# Patient Record
Sex: Male | Born: 1965 | Race: White | Hispanic: No | Marital: Married | Smoking: Former smoker
Health system: Southern US, Community
[De-identification: ages and names within clinical notes are randomized; demographics above are authoritative.]

## PROBLEM LIST (undated history)

## (undated) DIAGNOSIS — I10 Essential (primary) hypertension: Secondary | ICD-10-CM

## (undated) DIAGNOSIS — I729 Aneurysm of unspecified site: Secondary | ICD-10-CM

## (undated) DIAGNOSIS — E538 Deficiency of other specified B group vitamins: Secondary | ICD-10-CM

## (undated) HISTORY — PX: ADENOIDECTOMY: SUR15

## (undated) HISTORY — DX: Deficiency of other specified B group vitamins: E53.8

## (undated) HISTORY — PX: WISDOM TOOTH EXTRACTION: SHX21

---

## 2000-06-23 ENCOUNTER — Other Ambulatory Visit: Admission: RE | Admit: 2000-06-23 | Discharge: 2000-06-23 | Payer: Self-pay | Admitting: *Deleted

## 2002-02-14 ENCOUNTER — Encounter: Payer: Self-pay | Admitting: Family Medicine

## 2002-02-14 ENCOUNTER — Ambulatory Visit (HOSPITAL_COMMUNITY): Admission: RE | Admit: 2002-02-14 | Discharge: 2002-02-14 | Payer: Self-pay | Admitting: Family Medicine

## 2002-02-28 ENCOUNTER — Encounter: Payer: Self-pay | Admitting: Neurosurgery

## 2002-02-28 ENCOUNTER — Encounter: Admission: RE | Admit: 2002-02-28 | Discharge: 2002-02-28 | Payer: Self-pay | Admitting: Neurosurgery

## 2002-03-14 ENCOUNTER — Encounter: Payer: Self-pay | Admitting: Neurosurgery

## 2002-03-14 ENCOUNTER — Encounter: Admission: RE | Admit: 2002-03-14 | Discharge: 2002-03-14 | Payer: Self-pay | Admitting: Neurosurgery

## 2015-05-18 ENCOUNTER — Ambulatory Visit (INDEPENDENT_AMBULATORY_CARE_PROVIDER_SITE_OTHER): Payer: BLUE CROSS/BLUE SHIELD | Admitting: Family Medicine

## 2015-05-18 ENCOUNTER — Encounter: Payer: Self-pay | Admitting: Family Medicine

## 2015-05-18 VITALS — BP 130/90 | Temp 98.3°F | Wt 201.2 lb

## 2015-05-18 DIAGNOSIS — J189 Pneumonia, unspecified organism: Secondary | ICD-10-CM

## 2015-05-18 DIAGNOSIS — J209 Acute bronchitis, unspecified: Secondary | ICD-10-CM | POA: Diagnosis not present

## 2015-05-18 MED ORDER — CLARITHROMYCIN 500 MG PO TABS: 500.0000 mg | ORAL_TABLET | Freq: Two times a day (BID) | ORAL | Status: DC

## 2015-05-18 MED ORDER — HYDROCODONE-HOMATROPINE 5-1.5 MG/5ML PO SYRP: 5.0000 mL | ORAL_SOLUTION | Freq: Four times a day (QID) | ORAL | Status: DC | PRN

## 2015-05-18 NOTE — Progress Notes (Signed)
   Subjective:    Patient ID: Derek Barrett, male    DOB: December 28, 1965, 50 y.o.   MRN: [id number]  Cough This is a new problem. The current episode started in the past 7 days. The problem has been gradually worsening. The cough is non-productive. Associated symptoms include chest pain, rhinorrhea, shortness of breath and wheezing. Pertinent negatives include no ear pain or fever. Nothing aggravates the symptoms. Treatments tried: Mucinex. tylenol. The treatment provided no relief.   Patient states that he has no other concerns at this time.    Review of Systems  Constitutional: Negative for fever and activity change.  HENT: Positive for congestion and rhinorrhea. Negative for ear pain.   Eyes: Negative for discharge.  Respiratory: Positive for cough, shortness of breath and wheezing.   Cardiovascular: Positive for chest pain.       Objective:   Physical Exam  Constitutional: He appears well-developed.  HENT:  Head: Normocephalic.  Mouth/Throat: Oropharynx is clear and moist. No oropharyngeal exudate.  Neck: Normal range of motion.  Cardiovascular: Normal rate, regular rhythm and normal heart sounds.   No murmur heard. Pulmonary/Chest: Effort normal. He has no wheezes.  Patient does have pulmonary congestion noted in the right middle lobe not in respiratory distress  Lymphadenopathy:    He has no cervical adenopathy.  Neurological: He exhibits normal muscle tone.  Skin: Skin is warm and dry.  Nursing note and vitals reviewed.     I believe patient has bronchitis he also has some findings of early pneumonia. Does not appear to be toxic should get better with medication patient was told if he gets worse to follow-up immediately call if any problems should be able to return to work by Wednesday. Cough medication only for home use    Assessment & Plan:  Early pneumonia Warning signs discussed If high fevers difficulty breathing or worse follow-up Antibiotics  prescribed Follow-up if ongoing troubles X-rays lab work not indicated.

## 2016-03-02 ENCOUNTER — Ambulatory Visit: Payer: BLUE CROSS/BLUE SHIELD | Admitting: Family Medicine

## 2016-03-16 ENCOUNTER — Ambulatory Visit (HOSPITAL_COMMUNITY)
Admission: RE | Admit: 2016-03-16 | Discharge: 2016-03-16 | Disposition: A | Discharge status: Home / Self Care | Payer: BLUE CROSS/BLUE SHIELD | Source: Ambulatory Visit | Attending: Family Medicine | Admitting: Family Medicine

## 2016-03-16 ENCOUNTER — Encounter: Payer: Self-pay | Admitting: Family Medicine

## 2016-03-16 ENCOUNTER — Ambulatory Visit (INDEPENDENT_AMBULATORY_CARE_PROVIDER_SITE_OTHER): Payer: BLUE CROSS/BLUE SHIELD | Admitting: Family Medicine

## 2016-03-16 VITALS — BP 140/90 | Ht 72.0 in | Wt 204.6 lb

## 2016-03-16 DIAGNOSIS — R059 Cough, unspecified: Secondary | ICD-10-CM

## 2016-03-16 DIAGNOSIS — Z131 Encounter for screening for diabetes mellitus: Secondary | ICD-10-CM

## 2016-03-16 DIAGNOSIS — R0781 Pleurodynia: Secondary | ICD-10-CM

## 2016-03-16 DIAGNOSIS — Z13220 Encounter for screening for lipoid disorders: Secondary | ICD-10-CM | POA: Diagnosis not present

## 2016-03-16 DIAGNOSIS — X58XXXA Exposure to other specified factors, initial encounter: Secondary | ICD-10-CM | POA: Insufficient documentation

## 2016-03-16 DIAGNOSIS — Z125 Encounter for screening for malignant neoplasm of prostate: Secondary | ICD-10-CM | POA: Diagnosis not present

## 2016-03-16 DIAGNOSIS — R05 Cough: Secondary | ICD-10-CM | POA: Insufficient documentation

## 2016-03-16 DIAGNOSIS — S2241XA Multiple fractures of ribs, right side, initial encounter for closed fracture: Secondary | ICD-10-CM | POA: Insufficient documentation

## 2016-03-16 NOTE — Progress Notes (Signed)
   Subjective:    Patient ID: Derek Barrett, male    DOB: [birth year], 50 y.o.   MRN: [id number]  HPI Patient arrives to discuss some personal issues.   Patient states he fell a while back and he thinks he broke a rib that he did not have treated and he reports a cough since this happened.Patient states he has soreness pain discomfort in the right ribs. He is also noted a chronic cough over the past 6 months no hemoptysis no fevers no sweats or chills. Denies shortness of breath. He is a smoker is been smoking from [place] 27 years of pack a day   Patient would also like to have some routine labs. 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 Negative for activity change and fever.  HENT: Positive for sneezing. Negative for congestion, ear pain and rhinorrhea.   Eyes: Negative for discharge.  Respiratory: Positive for cough and shortness of breath. Negative for wheezing.   Cardiovascular: Negative for chest pain.  Gastrointestinal: Negative for abdominal pain.       Objective:   Physical Exam  Constitutional: He appears well-nourished. No distress.  Cardiovascular: Normal rate, regular rhythm and normal heart sounds.   No murmur heard. Pulmonary/Chest: Effort normal and breath sounds normal. No respiratory distress.  Musculoskeletal: He exhibits no edema.  Lymphadenopathy:    He has no cervical adenopathy.  Neurological: He is alert.  Psychiatric: His behavior is normal.  Vitals reviewed.         Assessment & Plan:  Lab work ordered await results Chronic cough could be related to smoking encourage patient to quit smoking I went over multiple different ways he will think about it  X-ray of the chest and ribs causes a cough and a rib injury a need a CAT scan may need pulmonary function testing  Preventative labs ordered. Recommend preventative health  exam Recommend colonoscopy patient will consider

## 2016-03-17 ENCOUNTER — Telehealth: Payer: Self-pay | Admitting: Family Medicine

## 2016-03-17 DIAGNOSIS — R05 Cough: Secondary | ICD-10-CM

## 2016-03-17 DIAGNOSIS — R053 Chronic cough: Secondary | ICD-10-CM

## 2016-03-17 LAB — BASIC METABOLIC PANEL
BUN/Creatinine Ratio: 13 (ref 9–20)
BUN: 11 mg/dL (ref 6–24)
CO2: 24 mmol/L (ref 18–29)
Calcium: 9.6 mg/dL (ref 8.7–10.2)
Chloride: 103 mmol/L (ref 96–106)
Creatinine, Ser: 0.87 mg/dL (ref 0.76–1.27)
GFR calc Af Amer: 116 mL/min/{1.73_m2} (ref >59)
GFR calc non Af Amer: 101 mL/min/{1.73_m2} (ref >59)
Glucose: 108 mg/dL — ABNORMAL HIGH (ref 65–99)
Potassium: 5.1 mmol/L (ref 3.5–5.2)
Sodium: 143 mmol/L (ref 134–144)

## 2016-03-17 LAB — LIPID PANEL
Chol/HDL Ratio: 2.1 ratio units (ref 0.0–5.0)
Cholesterol, Total: 179 mg/dL (ref 100–199)
HDL: 84 mg/dL (ref >39)
LDL Calculated: 84 mg/dL (ref 0–99)
Triglycerides: 55 mg/dL (ref 0–149)
VLDL Cholesterol Cal: 11 mg/dL (ref 5–40)

## 2016-03-17 LAB — CBC WITH DIFFERENTIAL/PLATELET
Basophils Absolute: 0 10*3/uL (ref 0.0–0.2)
Basos: 0 %
EOS (ABSOLUTE): 0.2 10*3/uL (ref 0.0–0.4)
Eos: 4 %
Hematocrit: 43.7 % (ref 37.5–51.0)
Hemoglobin: 15.4 g/dL (ref 12.6–17.7)
Immature Grans (Abs): 0 10*3/uL (ref 0.0–0.1)
Immature Granulocytes: 0 %
Lymphocytes Absolute: 1.6 10*3/uL (ref 0.7–3.1)
Lymphs: 34 %
MCH: 33.5 pg — ABNORMAL HIGH (ref 26.6–33.0)
MCHC: 35.2 g/dL (ref 31.5–35.7)
MCV: 95 fL (ref 79–97)
Monocytes Absolute: 0.4 10*3/uL (ref 0.1–0.9)
Monocytes: 9 %
Neutrophils Absolute: 2.4 10*3/uL (ref 1.4–7.0)
Neutrophils: 53 %
Platelets: 218 10*3/uL (ref 150–379)
RBC: 4.6 x10E6/uL (ref 4.14–5.80)
RDW: 12.7 % (ref 12.3–15.4)
WBC: 4.6 10*3/uL (ref 3.4–10.8)

## 2016-03-17 LAB — HEPATIC FUNCTION PANEL
ALT: 12 IU/L (ref 0–44)
AST: 18 IU/L (ref 0–40)
Albumin: 4.6 g/dL (ref 3.5–5.5)
Alkaline Phosphatase: 79 IU/L (ref 39–117)
Bilirubin Total: 0.4 mg/dL (ref 0.0–1.2)
Bilirubin, Direct: 0.15 mg/dL (ref 0.00–0.40)
Total Protein: 7.2 g/dL (ref 6.0–8.5)

## 2016-03-17 LAB — PSA: Prostate Specific Ag, Serum: 0.4 ng/mL (ref 0.0–4.0)

## 2016-03-17 NOTE — Telephone Encounter (Signed)
I discussed the results of the x-rays and his lab work with the patient. This patient is been having a chronic cough for 6 months. He is a lifelong smoker. He has been counseled to quit. I told the patient because of his chronic cough this needs to be evaluated further. I recommend for this patient a CT scan of the chest as well as pulmonary function tests pre-and post included. Nurse's-please help set up these tests.(The patient was told that if scan is not covered we may need to get him in with pulmonology)

## 2016-03-18 NOTE — Telephone Encounter (Signed)
Left message on voicemail to return call. (CT chest and PFT order in the sytem) Need to know when patient can go for CT scan. Hospital will contact patient to schedule PFT.

## 2016-03-18 NOTE — Addendum Note (Signed)
Addended by: Jesusita Oka on: 03/18/2016 09:38 AM   Modules accepted: Orders

## 2016-03-22 NOTE — Telephone Encounter (Signed)
Talked with patient and he wants this test pre authorized first before scheduling to make sure it is paid for.

## 2016-03-24 ENCOUNTER — Telehealth: Payer: Self-pay | Admitting: Family Medicine

## 2016-03-24 NOTE — Telephone Encounter (Signed)
Pt can go for scan on Friday morning, if not then he can go Thursday am.

## 2016-03-24 NOTE — Telephone Encounter (Signed)
Test scheduled at [facility] oct 31st 10 am register 9:45. Pt notified of appt and also notified that [facility] will contact him for the PFT scheduling.

## 2016-03-24 NOTE — Telephone Encounter (Signed)
Left message return call to inform patient that we have his CT Chest w/o contrast approved through insurance at [facility] and we now just need to schedule it.

## 2016-04-01 ENCOUNTER — Ambulatory Visit (HOSPITAL_COMMUNITY): Payer: BLUE CROSS/BLUE SHIELD

## 2016-04-05 ENCOUNTER — Ambulatory Visit (HOSPITAL_COMMUNITY)
Admission: RE | Admit: 2016-04-05 | Discharge: 2016-04-05 | Disposition: A | Discharge status: Home / Self Care | Payer: BLUE CROSS/BLUE SHIELD | Source: Ambulatory Visit | Attending: Family Medicine | Admitting: Family Medicine

## 2016-04-05 DIAGNOSIS — R05 Cough: Secondary | ICD-10-CM | POA: Insufficient documentation

## 2016-04-05 DIAGNOSIS — J40 Bronchitis, not specified as acute or chronic: Secondary | ICD-10-CM | POA: Diagnosis not present

## 2016-04-05 DIAGNOSIS — I712 Thoracic aortic aneurysm, without rupture: Secondary | ICD-10-CM | POA: Diagnosis not present

## 2016-04-05 DIAGNOSIS — I2510 Atherosclerotic heart disease of native coronary artery without angina pectoris: Secondary | ICD-10-CM | POA: Insufficient documentation

## 2016-04-05 DIAGNOSIS — S2241XA Multiple fractures of ribs, right side, initial encounter for closed fracture: Secondary | ICD-10-CM | POA: Insufficient documentation

## 2016-04-05 DIAGNOSIS — X58XXXA Exposure to other specified factors, initial encounter: Secondary | ICD-10-CM | POA: Diagnosis not present

## 2016-04-06 ENCOUNTER — Other Ambulatory Visit: Payer: Self-pay | Admitting: *Deleted

## 2016-04-06 DIAGNOSIS — I712 Thoracic aortic aneurysm, without rupture, unspecified: Secondary | ICD-10-CM

## 2016-04-07 ENCOUNTER — Encounter: Payer: Self-pay | Admitting: Family Medicine

## 2016-04-20 ENCOUNTER — Institutional Professional Consult (permissible substitution) (INDEPENDENT_AMBULATORY_CARE_PROVIDER_SITE_OTHER): Payer: BLUE CROSS/BLUE SHIELD | Admitting: Surgery

## 2016-04-20 ENCOUNTER — Encounter: Payer: Self-pay | Admitting: Surgery

## 2016-04-20 VITALS — BP 148/97 | HR 75 | Resp 18 | Ht 72.0 in | Wt 204.0 lb

## 2016-04-20 DIAGNOSIS — I712 Thoracic aortic aneurysm, without rupture, unspecified: Secondary | ICD-10-CM

## 2016-04-21 ENCOUNTER — Encounter: Payer: Self-pay | Admitting: Family Medicine

## 2016-04-21 ENCOUNTER — Encounter: Payer: Self-pay | Admitting: Surgery

## 2016-04-21 DIAGNOSIS — I712 Thoracic aortic aneurysm, without rupture, unspecified: Secondary | ICD-10-CM | POA: Insufficient documentation

## 2016-04-21 NOTE — Progress Notes (Signed)
PCP is Sallee Lange, MD Referring Provider is Kathyrn Drown, MD  Chief Complaint  Patient presents with  . Thoracic Aortic Aneurysm    Surgical eval, Chest CT 04/05/16    HPI:  The patient is a 50 year old healthy gentleman who fell several months ago striking his right lateral chest and back. He had a lot of pain but did not seek medical attention. He saw Dr. Wolfgang Phoenix last month and reported a 6 month history of cough and with his long smoking history a CXR was done. This showed fractures of the anterior 8th and 9th ribs but no lung abnormality to account for his cough. He had a CT of the chest done on 10/31 which showed healing fractures of the right 7th through 12th ribs and an incidental 4.0 cm fusiform ascending aortic aneurysm. There is no family history of aneurysm disease or bicuspid aortic valve disease. No family history of connective tissue disorder.  History reviewed. No pertinent past medical history.  History reviewed. No pertinent surgical history.  History reviewed. No pertinent family history.  Social History Social History  Substance Use Topics  . Smoking status: Current Every Day Smoker    Packs/day: 1.00  . Smokeless tobacco: Never Used     Comment: 1 pack a day since age 45  . Alcohol use Not on file    No current outpatient prescriptions on file.   No current facility-administered medications for this visit.     Allergies  Allergen Reactions  . Penicillins     Review of Systems  Constitutional: Negative for activity change, appetite change, chills, fatigue, fever and unexpected weight change.  HENT: Negative.   Eyes: Negative.   Respiratory: Positive for cough. Negative for shortness of breath and wheezing.   Cardiovascular: Negative.  Negative for chest pain.  Gastrointestinal: Negative.   Endocrine: Negative.   Genitourinary: Negative.   Musculoskeletal:       Mild residual pain right lateral and posterior chest wall.  Skin: Negative.    Allergic/Immunologic: Negative.   Neurological: Negative.   Hematological: Negative.   Psychiatric/Behavioral: Negative.     BP (!) 148/97 (BP Location: Left Arm, Patient Position: Sitting, Cuff Size: Normal)   Pulse 75   Resp 18   Ht 6' (1.829 m)   Wt 204 lb (92.5 kg)   SpO2 97% Comment: RA  BMI 27.67 kg/m  Physical Exam  Constitutional: He is oriented to person, place, and time. He appears well-developed and well-nourished. No distress.  HENT:  Head: Normocephalic and atraumatic.  Mouth/Throat: Oropharynx is clear and moist.  Eyes: EOM are normal. Pupils are equal, round, and reactive to light.  Neck: Neck supple. No JVD present. No thyromegaly present.  Cardiovascular: Normal rate, regular rhythm and normal heart sounds.   No murmur heard. Pulmonary/Chest: Effort normal and breath sounds normal. No respiratory distress. He has no wheezes. He exhibits no tenderness.  Abdominal: Soft. Bowel sounds are normal. He exhibits no distension and no mass. There is no tenderness.  Musculoskeletal: Normal range of motion. He exhibits no edema.  Lymphadenopathy:    He has no cervical adenopathy.  Neurological: He is alert and oriented to person, place, and time. He has normal strength. No cranial nerve deficit or sensory deficit.  Skin: Skin is warm and dry.  Psychiatric: He has a normal mood and affect.    Diagnostic Tests:  CLINICAL DATA:  Chronic cough for 6 months, rib fractures secondary to a fall 3-4  months ago, smoker  EXAM: CT CHEST WITHOUT CONTRAST  TECHNIQUE: Multidetector CT imaging of the chest was performed following the standard protocol without IV contrast. Sagittal and coronal MPR images reconstructed from axial data set.  COMPARISON:  None; correlation chest radiograph 03/16/2016  FINDINGS: Cardiovascular: Minimal coronary arterial calcification. Aneurysmal dilatation ascending thoracic aorta 4.0 cm transverse image 77. No pericardial  effusion.  Mediastinum/Nodes: Esophagus unremarkable. No thoracic adenopathy, with scattered normal sized mediastinal and axillary nodes noted. Base of cervical region unremarkable.  Lungs/Pleura: Central peribronchial thickening. Lungs clear. No infiltrate, pleural effusion or pneumothorax. No definite pulmonary mass/ nodule.  Upper Abdomen: Visualized upper abdomen unremarkable.  Musculoskeletal: Healing fractures of the RIGHT seventh, eighth, ninth, tenth, eleventh, and twelfth ribs. No additional osseous abnormalities.  IMPRESSION: Healing fractures of the RIGHT seventh through twelfth ribs.  Minimal coronary arterial calcification.  Central bronchitic changes without infiltrate.  Aneurysmal dilatation ascending thoracic aorta 4.0 cm greatest diameter, recommendation below Recommend annual imaging followup by CTA or MRA. This recommendation follows 2010 ACCF/AHA/AATS/ACR/ASA/SCA/SCAI/SIR/STS/SVM Guidelines for the Diagnosis and Management of Patients with Thoracic Aortic Disease. Circulation. 2010; [id number].   Electronically Signed   By: Lavonia Dana M.D.   On: 04/05/2016 10:49  Impression:  I have personally reviewed and interpreted his CT scan of the chest. He has a 4.0 cm fusiform ascending aortic aneurysm extending from the sinotubular junction to the proximal aortic arch. This is still well below the threshold for surgical treatment which is usually around 5.5 cm. It is unclear how long his aorta has been this size or whether it is changing. I think the best course of action is continued follow up to establish stability with a CT scan in one year. If it remains stable for a couple years then spacing the scans out to every other year is fine with this size of an aneurysm. I reviewed the images with him and his wife and answered their questions. I discussed the importance of good blood pressure control and smoking cessation. He has 6 healing rib fractures on  the right and there is nothing to do about those. They should heal with time and cause no long term ill effects.    Plan:  I will see him back in one year with a CTA of the chest.   I spent 45 minutes performing this consultation and > 50% of this time was spent face to face counseling and coordinating the care of this patient's ascending aortic aneurysm and rib fractures.   Gaye Pollack, MD [facility] [phone]

## 2016-06-08 ENCOUNTER — Ambulatory Visit: Payer: BLUE CROSS/BLUE SHIELD | Admitting: Family Medicine

## 2017-03-30 ENCOUNTER — Other Ambulatory Visit: Payer: Self-pay | Admitting: Surgery

## 2017-03-30 DIAGNOSIS — I7121 Aneurysm of the ascending aorta, without rupture: Secondary | ICD-10-CM

## 2017-03-30 DIAGNOSIS — I712 Thoracic aortic aneurysm, without rupture: Secondary | ICD-10-CM

## 2017-04-19 ENCOUNTER — Ambulatory Visit: Payer: BLUE CROSS/BLUE SHIELD | Admitting: Surgery

## 2017-04-19 ENCOUNTER — Other Ambulatory Visit: Payer: BLUE CROSS/BLUE SHIELD

## 2017-04-25 ENCOUNTER — Ambulatory Visit: Payer: BLUE CROSS/BLUE SHIELD | Admitting: Surgery

## 2017-04-26 ENCOUNTER — Ambulatory Visit: Payer: BLUE CROSS/BLUE SHIELD | Admitting: Surgery

## 2017-05-10 ENCOUNTER — Ambulatory Visit: Payer: BLUE CROSS/BLUE SHIELD | Admitting: Surgery

## 2017-05-10 ENCOUNTER — Other Ambulatory Visit: Payer: Self-pay

## 2017-05-10 ENCOUNTER — Encounter: Payer: Self-pay | Admitting: Surgery

## 2017-05-10 ENCOUNTER — Ambulatory Visit
Admission: RE | Admit: 2017-05-10 | Discharge: 2017-05-10 | Disposition: A | Discharge status: Home / Self Care | Payer: BLUE CROSS/BLUE SHIELD | Source: Ambulatory Visit | Attending: Surgery | Admitting: Surgery

## 2017-05-10 VITALS — BP 190/103 | HR 79 | Resp 18 | Ht 72.0 in | Wt 200.0 lb

## 2017-05-10 DIAGNOSIS — I7121 Aneurysm of the ascending aorta, without rupture: Secondary | ICD-10-CM

## 2017-05-10 DIAGNOSIS — I712 Thoracic aortic aneurysm, without rupture, unspecified: Secondary | ICD-10-CM

## 2017-05-10 MED ORDER — IOPAMIDOL (ISOVUE-370) INJECTION 76%
75.0000 mL | Freq: Once | INTRAVENOUS | Status: AC | PRN
  Administered 2017-05-10: 75 mL via INTRAVENOUS

## 2017-05-10 NOTE — Progress Notes (Signed)
     HPI:  The patient returns today for followup of an ascending aortic aneurysm that was noted to be 4.0 cm on CT scan in 03/2016. He denies any chest or back pain. He continues to smoke. He is with his wife today who has been encouraging him to stop smoking.   No current outpatient medications on file.   No current facility-administered medications for this visit.      Physical Exam: BP (!) 190/103 (BP Location: Left Arm, Cuff Size: Large)   Pulse 79   Resp 18   Ht 6' (1.829 m)   Wt 200 lb (90.7 kg)   SpO2 98% Comment: on RA  BMI 27.12 kg/m   He looks well. Lung exam is clear. Cardiac exam shows a regular rate and rhythm with normal heart sounds. There is no peripheral edema.   Diagnostic Tests:  CLINICAL DATA:  Ascending thoracic aortic aneurysm.  EXAM: CT ANGIOGRAPHY CHEST WITH CONTRAST  TECHNIQUE: Multidetector CT imaging of the chest was performed using the standard protocol during bolus administration of intravenous contrast. Multiplanar CT image reconstructions and MIPs were obtained to evaluate the vascular anatomy.  CONTRAST:  60mL ISOVUE-370 IOPAMIDOL (ISOVUE-370) INJECTION 76%  COMPARISON:  CT scan of April 05, 2016.  FINDINGS: Cardiovascular: Ascending thoracic aorta has maximum measured diameter 3.8 cm on this exam. Transverse aortic arch measures 2.8 cm. Proximal descending thoracic aorta measures 2.7 cm. No dissection is noted. Normal cardiac size. No pericardial effusion is noted.  Mediastinum/Nodes: No enlarged mediastinal, hilar, or axillary lymph nodes. Thyroid gland, trachea, and esophagus demonstrate no significant findings.  Lungs/Pleura: Lungs are clear. No pleural effusion or pneumothorax.  Upper Abdomen: No acute abnormality.  Musculoskeletal: No chest wall abnormality. No acute or significant osseous findings.  Review of the MIP images confirms the above findings.  IMPRESSION: There is no definite evidence of  thoracic aortic aneurysm seen on this exam, with the ascending aorta having a maximum measured diameter of 3.8 cm. No dissection is noted. No further follow-up is required.  No other abnormality seen in the chest.   Electronically Signed   By: Marijo Conception, M.D.   On: 05/10/2017 14:37   Impression:  The maximum diameter of the ascending aorta is 3.8 cm compared to his scan last year when it was 4.0 cm. I think this is just due to the error of measurement and the aorta is likely unchanged. This is mildly enlarged. I reviewed the CT images with him and and wife and answered their questions. He does have severe hypertension today and his initial BP today was 190/111.  He has not been treated for HTN and is on no meds. When I saw him last year his BP was 148/97. He does not check his BP at home. I discussed the importance of maintaining normal BP for preventing enlargement of the aorta and aortic dissection.   Plan:  He is going to follow up with Dr. Wolfgang Phoenix ASAP concerning his hypertension. I will see him back in two years for a repeat chest CT since the aorta is only 3.8 cm.   I spent 15 minutes performing this established patient evaluation and > 50% of this time was spent face to face counseling and coordinating the care of this patient's aortic aneurysm.    Gaye Pollack, MD [facility] [phone]

## 2018-03-04 ENCOUNTER — Encounter: Payer: Self-pay | Admitting: Emergency Medicine

## 2018-03-04 ENCOUNTER — Other Ambulatory Visit: Payer: Self-pay

## 2018-03-04 ENCOUNTER — Emergency Department
Admission: EM | Admit: 2018-03-04 | Discharge: 2018-03-04 | Disposition: A | Discharge status: Home / Self Care | Payer: BLUE CROSS/BLUE SHIELD | Attending: Emergency Medicine | Admitting: Emergency Medicine

## 2018-03-04 DIAGNOSIS — F17210 Nicotine dependence, cigarettes, uncomplicated: Secondary | ICD-10-CM | POA: Insufficient documentation

## 2018-03-04 DIAGNOSIS — I10 Essential (primary) hypertension: Secondary | ICD-10-CM | POA: Insufficient documentation

## 2018-03-04 HISTORY — DX: Aneurysm of unspecified site: I72.9

## 2018-03-04 LAB — COMPREHENSIVE METABOLIC PANEL
ALBUMIN: 4.6 g/dL (ref 3.5–5.0)
ALT: 13 U/L (ref 0–44)
ANION GAP: 9 (ref 5–15)
AST: 19 U/L (ref 15–41)
Alkaline Phosphatase: 67 U/L (ref 38–126)
BUN: 14 mg/dL (ref 6–20)
CO2: 27 mmol/L (ref 22–32)
Calcium: 9.4 mg/dL (ref 8.9–10.3)
Chloride: 104 mmol/L (ref 98–111)
Creatinine, Ser: 0.88 mg/dL (ref 0.61–1.24)
GFR calc Af Amer: >60 mL/min (ref >60)
GFR calc non Af Amer: >60 mL/min (ref >60)
GLUCOSE: 106 mg/dL — AB (ref 70–99)
POTASSIUM: 4.6 mmol/L (ref 3.5–5.1)
SODIUM: 140 mmol/L (ref 135–145)
Total Bilirubin: 1.1 mg/dL (ref 0.3–1.2)
Total Protein: 7.5 g/dL (ref 6.5–8.1)

## 2018-03-04 LAB — CBC WITH DIFFERENTIAL/PLATELET
Basophils Absolute: 0 10*3/uL (ref 0–0.1)
Basophils Relative: 0 %
EOS ABS: 0.2 10*3/uL (ref 0–0.7)
EOS PCT: 3 %
HCT: 49.4 % (ref 40.0–52.0)
Hemoglobin: 17.5 g/dL (ref 13.0–18.0)
Lymphocytes Relative: 42 %
Lymphs Abs: 2.3 10*3/uL (ref 1.0–3.6)
MCH: 35.5 pg — ABNORMAL HIGH (ref 26.0–34.0)
MCHC: 35.3 g/dL (ref 32.0–36.0)
MCV: 100.4 fL — ABNORMAL HIGH (ref 80.0–100.0)
MONO ABS: 0.6 10*3/uL (ref 0.2–1.0)
MONOS PCT: 11 %
NEUTROS PCT: 44 %
Neutro Abs: 2.5 10*3/uL (ref 1.4–6.5)
Platelets: 223 10*3/uL (ref 150–440)
RBC: 4.92 MIL/uL (ref 4.40–5.90)
RDW: 12.7 % (ref 11.5–14.5)
WBC: 5.6 10*3/uL (ref 3.8–10.6)

## 2018-03-04 LAB — TROPONIN I: Troponin I: <0.03 ng/mL (ref <0.03)

## 2018-03-04 LAB — ETHANOL

## 2018-03-04 MED ORDER — HYDROCHLOROTHIAZIDE 25 MG PO TABS
ORAL_TABLET | ORAL | Status: AC
  Filled 2018-03-04: qty 1

## 2018-03-04 MED ORDER — HYDROCHLOROTHIAZIDE 25 MG PO TABS
25.0000 mg | ORAL_TABLET | Freq: Every day | ORAL | Status: DC
  Administered 2018-03-04: 25 mg via ORAL

## 2018-03-04 MED ORDER — HYDROCHLOROTHIAZIDE 25 MG PO TABS: 25.0000 mg | ORAL_TABLET | Freq: Every day | ORAL | 1 refills | Status: DC

## 2018-03-04 NOTE — ED Notes (Signed)
Discharge instructions reviewed with patient. Questions fielded by this RN. Patient verbalizes understanding of instructions. Patient discharged home in stable condition per williams. No acute distress noted at time of discharge.   No peripheral IV placed this visit.

## 2018-03-04 NOTE — ED Provider Notes (Signed)
[facility] Emergency Department Provider Note       Time seen: ----------------------------------------- 6:49 PM on 03/04/2018 -----------------------------------------   I have reviewed the triage vital signs and the nursing notes.  HISTORY   Chief Complaint Hypertension    HPI Derek Barrett is a 52 y.o. male with a history of thoracic aneurysm who presents to the ED for high blood pressure.  Patient was a feeling well today and he took his blood pressure at home and it was high so he came in.  He denies fevers, chills, chest pain, shortness of breath, headache or vision changes.  He was told to get his blood pressure checked by his primary care doctor and also by his cardiologist because of this reported history of aneurysm.  Past Medical History:  Diagnosis Date  . Aneurysm [facility])     Patient Active Problem List   Diagnosis Date Noted  . Thoracic aortic aneurysm ([place]) 04/21/2016    History reviewed. No pertinent surgical history.  Allergies Penicillins  Social History Social History   Tobacco Use  . Smoking status: Current Every Day Smoker    Packs/day: 1.00  . Smokeless tobacco: Never Used  . Tobacco comment: 1 pack a day since age 69  Substance Use Topics  . Alcohol use: Never    Alcohol/week: 0.0 standard drinks    Frequency: Never  . Drug use: Never   Review of Systems Constitutional: Negative for fever. Cardiovascular: Negative for chest pain. Respiratory: Negative for shortness of breath. Gastrointestinal: Negative for abdominal pain, vomiting and diarrhea. Musculoskeletal: Negative for back pain. Skin: Negative for rash. Neurological: Negative for headaches, focal weakness or numbness.  All systems negative/normal/unremarkable except as stated in the HPI  ____________________________________________   PHYSICAL EXAM:  VITAL SIGNS: ED Triage Vitals  Enc Vitals Group     BP 03/04/18 1816 (!) 209/103     Pulse  Rate 03/04/18 1816 72     Resp 03/04/18 1816 14     Temp 03/04/18 1816 98.2 F (36.8 C)     Temp Source 03/04/18 1816 Oral     SpO2 03/04/18 1816 97 %     Weight 03/04/18 1816 200 lb (90.7 kg)     Height 03/04/18 1816 6' (1.829 m)     Head Circumference --      Peak Flow --      Pain Score 03/04/18 1823 0     Pain Loc --      Pain Edu? --      Excl. in [place]? --    Constitutional: Alert and oriented. Well appearing and in no distress. Eyes: Conjunctivae are normal. Normal extraocular movements. ENT   Head: Normocephalic and atraumatic.   Nose: No congestion/rhinnorhea.   Mouth/Throat: Mucous membranes are moist.   Neck: No stridor. Cardiovascular: Normal rate, regular rhythm. No murmurs, rubs, or gallops. Respiratory: Normal respiratory effort without tachypnea nor retractions. Breath sounds are clear and equal bilaterally. No wheezes/rales/rhonchi. Gastrointestinal: Soft and nontender. Normal bowel sounds Musculoskeletal: Nontender with normal range of motion in extremities. No lower extremity tenderness nor edema. Neurologic:  Normal speech and language. No gross focal neurologic deficits are appreciated.  Skin:  Skin is warm, dry and intact. No rash noted. Psychiatric: Mood and affect are normal. Speech and behavior are normal.  ____________________________________________  ED COURSE:  As part of my medical decision making, I reviewed the following data within the [place] History obtained from family if available, nursing notes, old  chart and ekg, as well as notes from prior ED visits. Patient presented for hypertension, we will assess with labs and imaging as indicated at this time.   Procedures ____________________________________________   LABS (pertinent positives/negatives)  Labs Reviewed  CBC WITH DIFFERENTIAL/PLATELET - Abnormal; Notable for the following components:      Result Value   MCV 100.4 (*)    MCH 35.5 (*)    All other  components within normal limits  COMPREHENSIVE METABOLIC PANEL - Abnormal; Notable for the following components:   Glucose, Bld 106 (*)    All other components within normal limits  TROPONIN I  ETHANOL  ____________________________________________  DIFFERENTIAL DIAGNOSIS   Hypertension, alcohol use disorder, anxiety, renal insufficiency  FINAL ASSESSMENT AND PLAN  Hypertension   Plan: The patient had presented for hypertension. Patient's labs do likely indicate some degree of alcohol use disorder with a high MCV.  I will start the patient on HCTZ for his blood pressure but I suspect his blood pressure changes may be alcohol related.  I will advise a gradual tapering of his alcohol use until he stopped drinking altogether over the next month.  He is advised to have close outpatient follow-up with his doctor.   Laurence Aly, MD   Note: This note was generated in part or whole with voice recognition software. Voice recognition is usually quite accurate but there are transcription errors that can and very often do occur. I apologize for any typographical errors that were not detected and corrected.     Earleen Newport, MD 03/04/18 [phone]

## 2018-03-04 NOTE — ED Triage Notes (Signed)
Pt here for HTN.  Wasn't feeling well today and took bp at home and was high so came in.  Denies CP/SHOB/HA/vision changes.  Patient was told to get blood pressure checked at PCP by cardiologist because has aneurysm of aorta.  Pt has not FU with doctor to keep blood pressure under control.

## 2018-03-12 ENCOUNTER — Ambulatory Visit: Payer: BLUE CROSS/BLUE SHIELD | Admitting: Family Medicine

## 2018-03-12 ENCOUNTER — Encounter: Payer: Self-pay | Admitting: Family Medicine

## 2018-03-12 VITALS — BP 128/88 | Ht 72.0 in | Wt 196.8 lb

## 2018-03-12 DIAGNOSIS — I10 Essential (primary) hypertension: Secondary | ICD-10-CM | POA: Diagnosis not present

## 2018-03-12 DIAGNOSIS — Z13220 Encounter for screening for lipoid disorders: Secondary | ICD-10-CM | POA: Diagnosis not present

## 2018-03-12 DIAGNOSIS — Z79899 Other long term (current) drug therapy: Secondary | ICD-10-CM

## 2018-03-12 DIAGNOSIS — D7589 Other specified diseases of blood and blood-forming organs: Secondary | ICD-10-CM | POA: Diagnosis not present

## 2018-03-12 DIAGNOSIS — Z125 Encounter for screening for malignant neoplasm of prostate: Secondary | ICD-10-CM

## 2018-03-12 MED ORDER — POTASSIUM CHLORIDE ER 10 MEQ PO TBCR: 10.0000 meq | EXTENDED_RELEASE_TABLET | Freq: Every day | ORAL | 5 refills | Status: DC

## 2018-03-12 MED ORDER — HYDROCHLOROTHIAZIDE 25 MG PO TABS: 25.0000 mg | ORAL_TABLET | Freq: Every day | ORAL | 5 refills | Status: DC

## 2018-03-12 NOTE — Patient Instructions (Addendum)
DASH Eating Plan DASH stands for "Dietary Approaches to Stop Hypertension." The DASH eating plan is a healthy eating plan that has been shown to reduce high blood pressure (hypertension). It may also reduce your risk for type 2 diabetes, heart disease, and stroke. The DASH eating plan may also help with weight loss. What are tips for following this plan? General guidelines  Avoid eating more than 2,300 mg (milligrams) of salt (sodium) a day. If you have hypertension, you may need to reduce your sodium intake to 1,500 mg a day.  Limit alcohol intake to no more than 1 drink a day for nonpregnant women and 2 drinks a day for men. One drink equals 12 oz of beer, 5 oz of wine, or 1 oz of hard liquor.  Work with your health care provider to maintain a healthy body weight or to lose weight. Ask what an ideal weight is for you.  Get at least 30 minutes of exercise that causes your heart to beat faster (aerobic exercise) most days of the week. Activities may include walking, swimming, or biking.  Work with your health care provider or diet and nutrition specialist (dietitian) to adjust your eating plan to your individual calorie needs. Reading food labels  Check food labels for the amount of sodium per serving. Choose foods with less than 5 percent of the Daily Value of sodium. Generally, foods with less than 300 mg of sodium per serving fit into this eating plan.  To find whole grains, look for the word "whole" as the first word in the ingredient list. Shopping  Buy products labeled as "low-sodium" or "no salt added."  Buy fresh foods. Avoid canned foods and premade or frozen meals. Cooking  Avoid adding salt when cooking. Use salt-free seasonings or herbs instead of table salt or sea salt. Check with your health care provider or pharmacist before using salt substitutes.  Do not fry foods. Cook foods using healthy methods such as baking, boiling, grilling, and broiling instead.  Cook with  heart-healthy oils, such as olive, canola, soybean, or sunflower oil. Meal planning   Eat a balanced diet that includes: ? 5 or more servings of fruits and vegetables each day. At each meal, try to fill half of your plate with fruits and vegetables. ? Up to 6-8 servings of whole grains each day. ? Less than 6 oz of lean meat, poultry, or fish each day. A 3-oz serving of meat is about the same size as a deck of cards. One egg equals 1 oz. ? 2 servings of low-fat dairy each day. ? A serving of nuts, seeds, or beans 5 times each week. ? Heart-healthy fats. Healthy fats called Omega-3 fatty acids are found in foods such as flaxseeds and coldwater fish, like sardines, salmon, and mackerel.  Limit how much you eat of the following: ? Canned or prepackaged foods. ? Food that is high in trans fat, such as fried foods. ? Food that is high in saturated fat, such as fatty meat. ? Sweets, desserts, sugary drinks, and other foods with added sugar. ? Full-fat dairy products.  Do not salt foods before eating.  Try to eat at least 2 vegetarian meals each week.  Eat more home-cooked food and less restaurant, buffet, and fast food.  When eating at a restaurant, ask that your food be prepared with less salt or no salt, if possible. What foods are recommended? The items listed may not be a complete list. Talk with your dietitian about what   dietary choices are best for you. Grains Whole-grain or whole-wheat bread. Whole-grain or whole-wheat pasta. Brown rice. Oatmeal. Quinoa. Bulgur. Whole-grain and low-sodium cereals. Pita bread. Low-fat, low-sodium crackers. Whole-wheat flour tortillas. Vegetables Fresh or frozen vegetables (raw, steamed, roasted, or grilled). Low-sodium or reduced-sodium tomato and vegetable juice. Low-sodium or reduced-sodium tomato sauce and tomato paste. Low-sodium or reduced-sodium canned vegetables. Fruits All fresh, dried, or frozen fruit. Canned fruit in natural juice (without  added sugar). Meat and other protein foods Skinless chicken or turkey. Ground chicken or turkey. Pork with fat trimmed off. Fish and seafood. Egg whites. Dried beans, peas, or lentils. Unsalted nuts, nut butters, and seeds. Unsalted canned beans. Lean cuts of beef with fat trimmed off. Low-sodium, lean deli meat. Dairy Low-fat (1%) or fat-free (skim) milk. Fat-free, low-fat, or reduced-fat cheeses. Nonfat, low-sodium ricotta or cottage cheese. Low-fat or nonfat yogurt. Low-fat, low-sodium cheese. Fats and oils Soft margarine without trans fats. Vegetable oil. Low-fat, reduced-fat, or light mayonnaise and salad dressings (reduced-sodium). Canola, safflower, olive, soybean, and sunflower oils. Avocado. Seasoning and other foods Herbs. Spices. Seasoning mixes without salt. Unsalted popcorn and pretzels. Fat-free sweets. What foods are not recommended? The items listed may not be a complete list. Talk with your dietitian about what dietary choices are best for you. Grains Baked goods made with fat, such as croissants, muffins, or some breads. Dry pasta or rice meal packs. Vegetables Creamed or fried vegetables. Vegetables in a cheese sauce. Regular canned vegetables (not low-sodium or reduced-sodium). Regular canned tomato sauce and paste (not low-sodium or reduced-sodium). Regular tomato and vegetable juice (not low-sodium or reduced-sodium). Pickles. Olives. Fruits Canned fruit in a light or heavy syrup. Fried fruit. Fruit in cream or butter sauce. Meat and other protein foods Fatty cuts of meat. Ribs. Fried meat. Bacon. Sausage. Bologna and other processed lunch meats. Salami. Fatback. Hotdogs. Bratwurst. Salted nuts and seeds. Canned beans with added salt. Canned or smoked fish. Whole eggs or egg yolks. Chicken or turkey with skin. Dairy Whole or 2% milk, cream, and half-and-half. Whole or full-fat cream cheese. Whole-fat or sweetened yogurt. Full-fat cheese. Nondairy creamers. Whipped toppings.  Processed cheese and cheese spreads. Fats and oils Butter. Stick margarine. Lard. Shortening. Ghee. Bacon fat. Tropical oils, such as coconut, palm kernel, or palm oil. Seasoning and other foods Salted popcorn and pretzels. Onion salt, garlic salt, seasoned salt, table salt, and sea salt. Worcestershire sauce. Tartar sauce. Barbecue sauce. Teriyaki sauce. Soy sauce, including reduced-sodium. Steak sauce. Canned and packaged gravies. Fish sauce. Oyster sauce. Cocktail sauce. Horseradish that you find on the shelf. Ketchup. Mustard. Meat flavorings and tenderizers. Bouillon cubes. Hot sauce and Tabasco sauce. Premade or packaged marinades. Premade or packaged taco seasonings. Relishes. Regular salad dressings. Where to find more information:  National Heart, Lung, and Blood Institute: www.nhlbi.nih.gov  American Heart Association: www.heart.org Summary  The DASH eating plan is a healthy eating plan that has been shown to reduce high blood pressure (hypertension). It may also reduce your risk for type 2 diabetes, heart disease, and stroke.  With the DASH eating plan, you should limit salt (sodium) intake to 2,300 mg a day. If you have hypertension, you may need to reduce your sodium intake to 1,500 mg a day.  When on the DASH eating plan, aim to eat more fresh fruits and vegetables, whole grains, lean proteins, low-fat dairy, and heart-healthy fats.  Work with your health care provider or diet and nutrition specialist (dietitian) to adjust your eating plan to your individual   calorie needs. This information is not intended to replace advice given to you by your health care provider. Make sure you discuss any questions you have with your health care provider. Document Released: 05/12/2011 Document Revised: 05/16/2016 Document Reviewed: 05/16/2016 Elsevier Interactive Patient Education  2018 [employer].  Hypertension Hypertension is another name for high blood pressure. High blood pressure  forces your heart to work harder to pump blood. This can cause problems over time. There are two numbers in a blood pressure reading. There is a top number (systolic) over a bottom number (diastolic). It is best to have a blood pressure below 120/80. Healthy choices can help lower your blood pressure. You may need medicine to help lower your blood pressure if:  Your blood pressure cannot be lowered with healthy choices.  Your blood pressure is higher than 130/80.  Follow these instructions at home: Eating and drinking  If directed, follow the DASH eating plan. This diet includes: ? Filling half of your plate at each meal with fruits and vegetables. ? Filling one quarter of your plate at each meal with whole grains. Whole grains include whole wheat pasta, brown rice, and whole grain bread. ? Eating or drinking low-fat dairy products, such as skim milk or low-fat yogurt. ? Filling one quarter of your plate at each meal with low-fat (lean) proteins. Low-fat proteins include fish, skinless chicken, eggs, beans, and tofu. ? Avoiding fatty meat, cured and processed meat, or chicken with skin. ? Avoiding premade or processed food.  Eat less than 1,500 mg of salt (sodium) a day.  Limit alcohol use to no more than 1 drink a day for nonpregnant women and 2 drinks a day for men. One drink equals 12 oz of beer, 5 oz of wine, or 1 oz of hard liquor. Lifestyle  Work with your doctor to stay at a healthy weight or to lose weight. Ask your doctor what the best weight is for you.  Get at least 30 minutes of exercise that causes your heart to beat faster (aerobic exercise) most days of the week. This may include walking, swimming, or biking.  Get at least 30 minutes of exercise that strengthens your muscles (resistance exercise) at least 3 days a week. This may include lifting weights or pilates.  Do not use any products that contain nicotine or tobacco. This includes cigarettes and e-cigarettes. If you  need help quitting, ask your doctor.  Check your blood pressure at home as told by your doctor.  Keep all follow-up visits as told by your doctor. This is important. Medicines  Take over-the-counter and prescription medicines only as told by your doctor. Follow directions carefully.  Do not skip doses of blood pressure medicine. The medicine does not work as well if you skip doses. Skipping doses also puts you at risk for problems.  Ask your doctor about side effects or reactions to medicines that you should watch for. Contact a doctor if:  You think you are having a reaction to the medicine you are taking.  You have headaches that keep coming back (recurring).  You feel dizzy.  You have swelling in your ankles.  You have trouble with your vision. Get help right away if:  You get a very bad headache.  You start to feel confused.  You feel weak or numb.  You feel faint.  You get very bad pain in your: ? Chest. ? Belly (abdomen).  You throw up (vomit) more than once.  You have trouble breathing.   Hypertension is another name for high blood pressure.  Making healthy choices can help lower blood pressure. If your blood pressure cannot be controlled with healthy choices, you may need to take medicine. This information is not intended to replace advice given to you by your health care provider. Make sure you discuss any questions you have with your health care provider. Document Released: 11/09/2007 Document Revised: 04/20/2016 Document Reviewed: 04/20/2016 Elsevier Interactive Patient Education  2018 [facility] have had labs ordered as part of today's visit. These test are necessary for [place] to provide good care. Failure to complete the labs in a timely basis can cause significant risk to your health. It is your responsibility to do the test. These orders are good for 6 months after 6 months they are cancelled by the system and require new orders. Labs are drawn  at [facility], located down the hall on the way to the parking area.  .Please do your test as ordered!  All results are forwarded to you via my chart or mail. We will utilize a phone call for urgent findings. Letters typically take 7 to 14 days for you to receive after we receive results.  If you don't receive results within 2 weeks of doing your labs then  please call us for the results.  In the future,for chronic visits we encourage you to have labs ordered and completed a few days before your visit so we can have a detailed discussion about the results. Doing labs for chronic health issues ( thyroid,diabetes, cholesterol,etc.) before your visit allows for you to have a more thorough and efficient visit.  Lab orders can be given by our office to you by request.

## 2018-03-12 NOTE — Progress Notes (Signed)
   Subjective:    Patient ID: Derek Barrett, male    DOB: 05-Aug-1965, 52 y.o.   MRN: [id number]  HPI Pt here today for follow up from hospital. Pt went to ED on 03/01/18 for high bp.  The patient was in the ER because of elevated blood pressure He does try to watch his diet some but he eats fast foods a lot at lunch is also eats a lot of fast food breakfast He hopes to retire by the end of the year will make it easier for him to be healthy Does not exercise the way he should He does smoke a pack a day Also has 1 or 2 drinks every day sometimes more on the weekends He is aware of the importance of cutting back and quitting smoking He is aware of cutting back on alcohol use Also we discussed lower salt He denies chest tightness pressure pain or shortness of breath He states his overall energy level is fairly good He does have some stress but he denies being depressed denies being anxious does not feel like there is any problem there  Review of Systems  Constitutional: Negative for activity change, fatigue and fever.  HENT: Negative for congestion and rhinorrhea.   Respiratory: Negative for cough and shortness of breath.   Cardiovascular: Negative for chest pain and leg swelling.  Gastrointestinal: Negative for abdominal pain, diarrhea and nausea.  Genitourinary: Negative for dysuria and hematuria.  Neurological: Negative for weakness and headaches.  Psychiatric/Behavioral: Negative for agitation and behavioral problems.       Objective:   Physical Exam  Constitutional: He appears well-nourished. No distress.  HENT:  Head: Normocephalic and atraumatic.  Eyes: Right eye exhibits no discharge. Left eye exhibits no discharge.  Neck: No tracheal deviation present.  Cardiovascular: Normal rate, regular rhythm and normal heart sounds.  No murmur heard. Pulmonary/Chest: Effort normal and breath sounds normal. No respiratory distress.  Musculoskeletal: He exhibits no edema.    Lymphadenopathy:    He has no cervical adenopathy.  Neurological: He is alert. Coordination normal.  Skin: Skin is warm and dry.  Psychiatric: He has a normal mood and affect. His behavior is normal.  Vitals reviewed.         Assessment & Plan:  We talked at length about the importance of cutting back on alcohol I would like to see him to 1 or less drinks per day on most days and he could have 2 or 3 on the weekend  Also encouraged him to cut back on smoking to cut it back to have a day  Also talk with him about the importance of minimizing salt in the diet and eating healthier   Patient denies being depressed  Patient will still do HCTZ but add potassium to it  Patient will do lab work including folic acid and D14 because his MCV was elevated when he was in the ER  Blood pressure acceptable today  Patient needs to follow-up for prostate exam also will need colonoscopy.  Patient states he will follow-up later this year to cover all of these

## 2018-05-02 DIAGNOSIS — Z79899 Other long term (current) drug therapy: Secondary | ICD-10-CM | POA: Diagnosis not present

## 2018-05-02 DIAGNOSIS — Z125 Encounter for screening for malignant neoplasm of prostate: Secondary | ICD-10-CM | POA: Diagnosis not present

## 2018-05-02 DIAGNOSIS — D7589 Other specified diseases of blood and blood-forming organs: Secondary | ICD-10-CM | POA: Diagnosis not present

## 2018-05-02 DIAGNOSIS — Z13220 Encounter for screening for lipoid disorders: Secondary | ICD-10-CM | POA: Diagnosis not present

## 2018-05-03 LAB — LIPID PANEL
CHOL/HDL RATIO: 2 ratio (ref 0.0–5.0)
Cholesterol, Total: 178 mg/dL (ref 100–199)
HDL: 87 mg/dL (ref >39)
LDL CALC: 81 mg/dL (ref 0–99)
Triglycerides: 52 mg/dL (ref 0–149)
VLDL Cholesterol Cal: 10 mg/dL (ref 5–40)

## 2018-05-03 LAB — HEPATIC FUNCTION PANEL
ALBUMIN: 4.7 g/dL (ref 3.5–5.5)
ALK PHOS: 64 IU/L (ref 39–117)
ALT: 10 IU/L (ref 0–44)
AST: 12 IU/L (ref 0–40)
Bilirubin Total: 0.3 mg/dL (ref 0.0–1.2)
Bilirubin, Direct: 0.11 mg/dL (ref 0.00–0.40)
TOTAL PROTEIN: 6.9 g/dL (ref 6.0–8.5)

## 2018-05-03 LAB — BASIC METABOLIC PANEL
BUN/Creatinine Ratio: 13 (ref 9–20)
BUN: 12 mg/dL (ref 6–24)
CALCIUM: 10.1 mg/dL (ref 8.7–10.2)
CO2: 25 mmol/L (ref 20–29)
CREATININE: 0.91 mg/dL (ref 0.76–1.27)
Chloride: 102 mmol/L (ref 96–106)
GFR calc Af Amer: 112 mL/min/{1.73_m2} (ref >59)
GFR, EST NON AFRICAN AMERICAN: 97 mL/min/{1.73_m2} (ref >59)
Glucose: 102 mg/dL — ABNORMAL HIGH (ref 65–99)
Potassium: 5 mmol/L (ref 3.5–5.2)
Sodium: 142 mmol/L (ref 134–144)

## 2018-05-03 LAB — FOLATE: FOLATE: 5.6 ng/mL (ref >3.0)

## 2018-05-03 LAB — VITAMIN B12: VITAMIN B 12: 284 pg/mL (ref 232–1245)

## 2018-05-03 LAB — PSA: PROSTATE SPECIFIC AG, SERUM: 0.7 ng/mL (ref 0.0–4.0)

## 2018-05-04 ENCOUNTER — Encounter: Payer: Self-pay | Admitting: Family Medicine

## 2018-05-04 DIAGNOSIS — E538 Deficiency of other specified B group vitamins: Secondary | ICD-10-CM

## 2018-05-04 HISTORY — DX: Deficiency of other specified B group vitamins: E53.8

## 2018-07-16 IMAGING — DX DG RIBS 2V*R*
4 series · 4 of 4 positions shown · non-contrast
Comparison: Frontal and lateral chest radiographs earlier in the
day

CLINICAL DATA: Pain after fall

EXAM:
RIGHT RIBS - 2 VIEW

[rib pa]
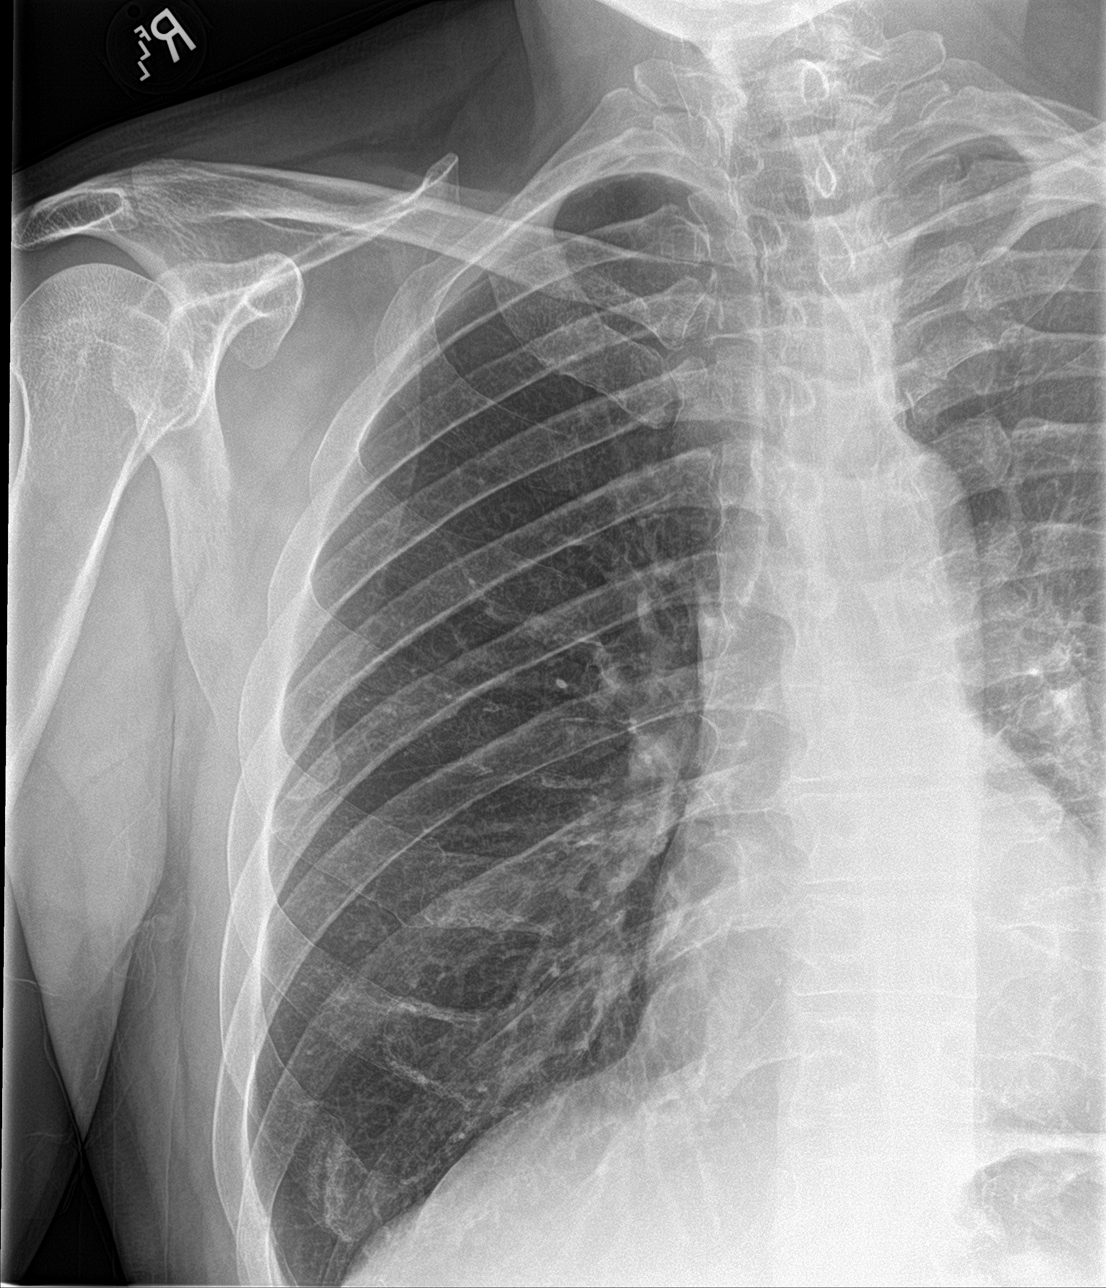

[rib pa obl (1 of 3)]
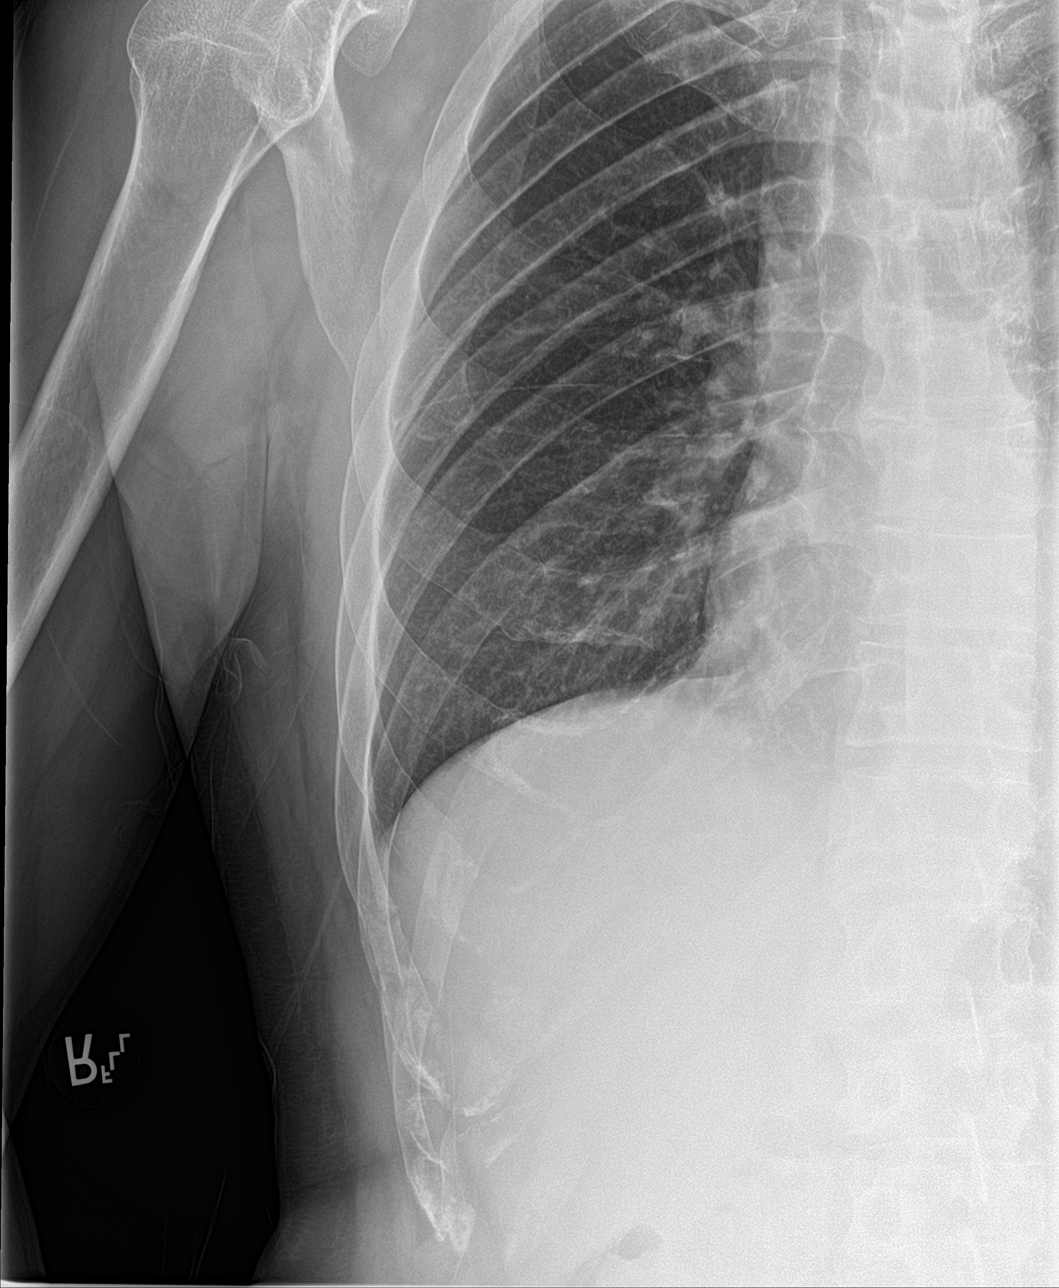

[rib pa obl (2 of 3)]
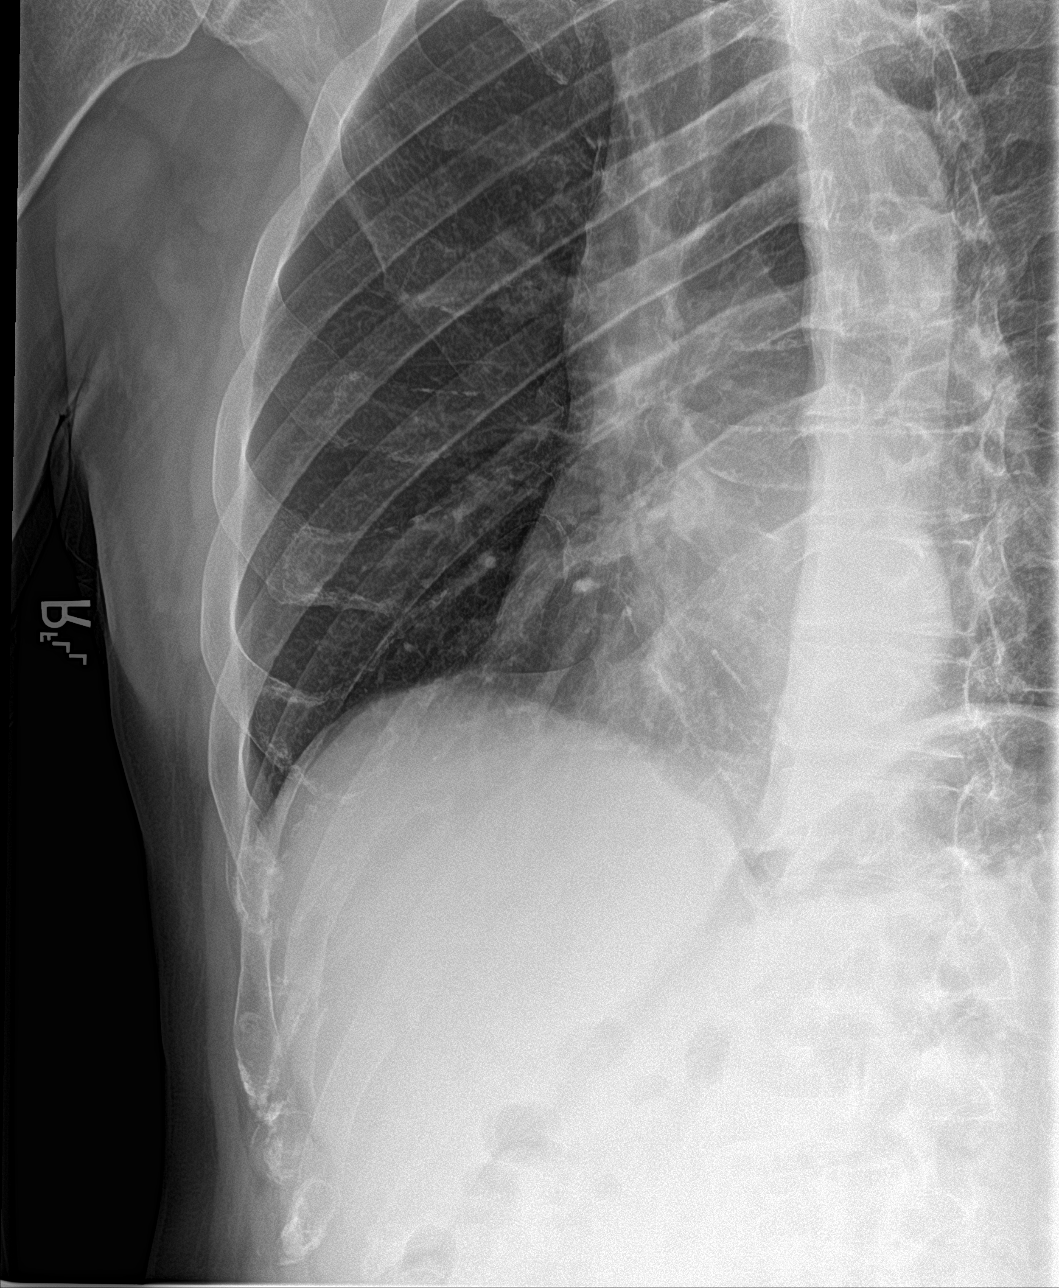

[rib pa obl (3 of 3)]
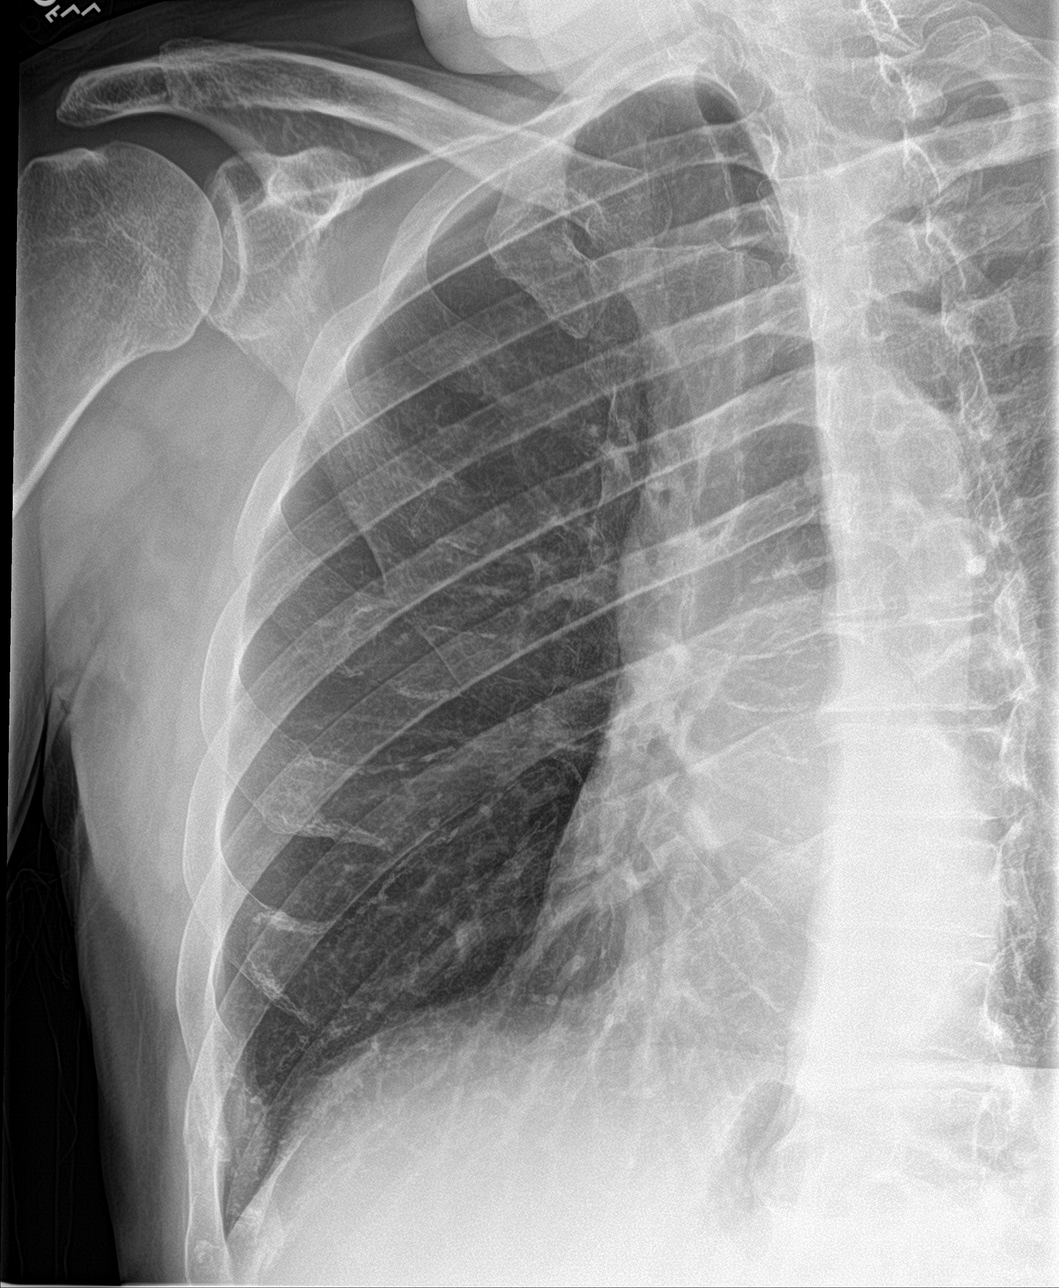

[4 of 4 positions shown; findings below may reference images not displayed]

FINDINGS: Oblique and cone-down lower rib images were obtained. There are
fractures of the anterior eighth, ninth, and tenth ribs. No
pneumothorax evident. No right pleural effusion. Visualize lungs
clear. Cardiac silhouette within normal limits.
IMPRESSION: Fractures of the anterior right eighth, ninth, and tenth ribs,
slightly displaced. No evident pneumothorax.

These results will be called to the ordering clinician or
representative by the Radiologist Assistant, and communication
documented in the PACS or zVision Dashboard.

## 2018-07-16 IMAGING — DX DG CHEST 2V
2 series · 2 of 2 positions shown · non-contrast
Comparison: None.

CLINICAL DATA: Persistent pain after fall 3 months prior

EXAM:
CHEST  2 VIEW

[chest pa]
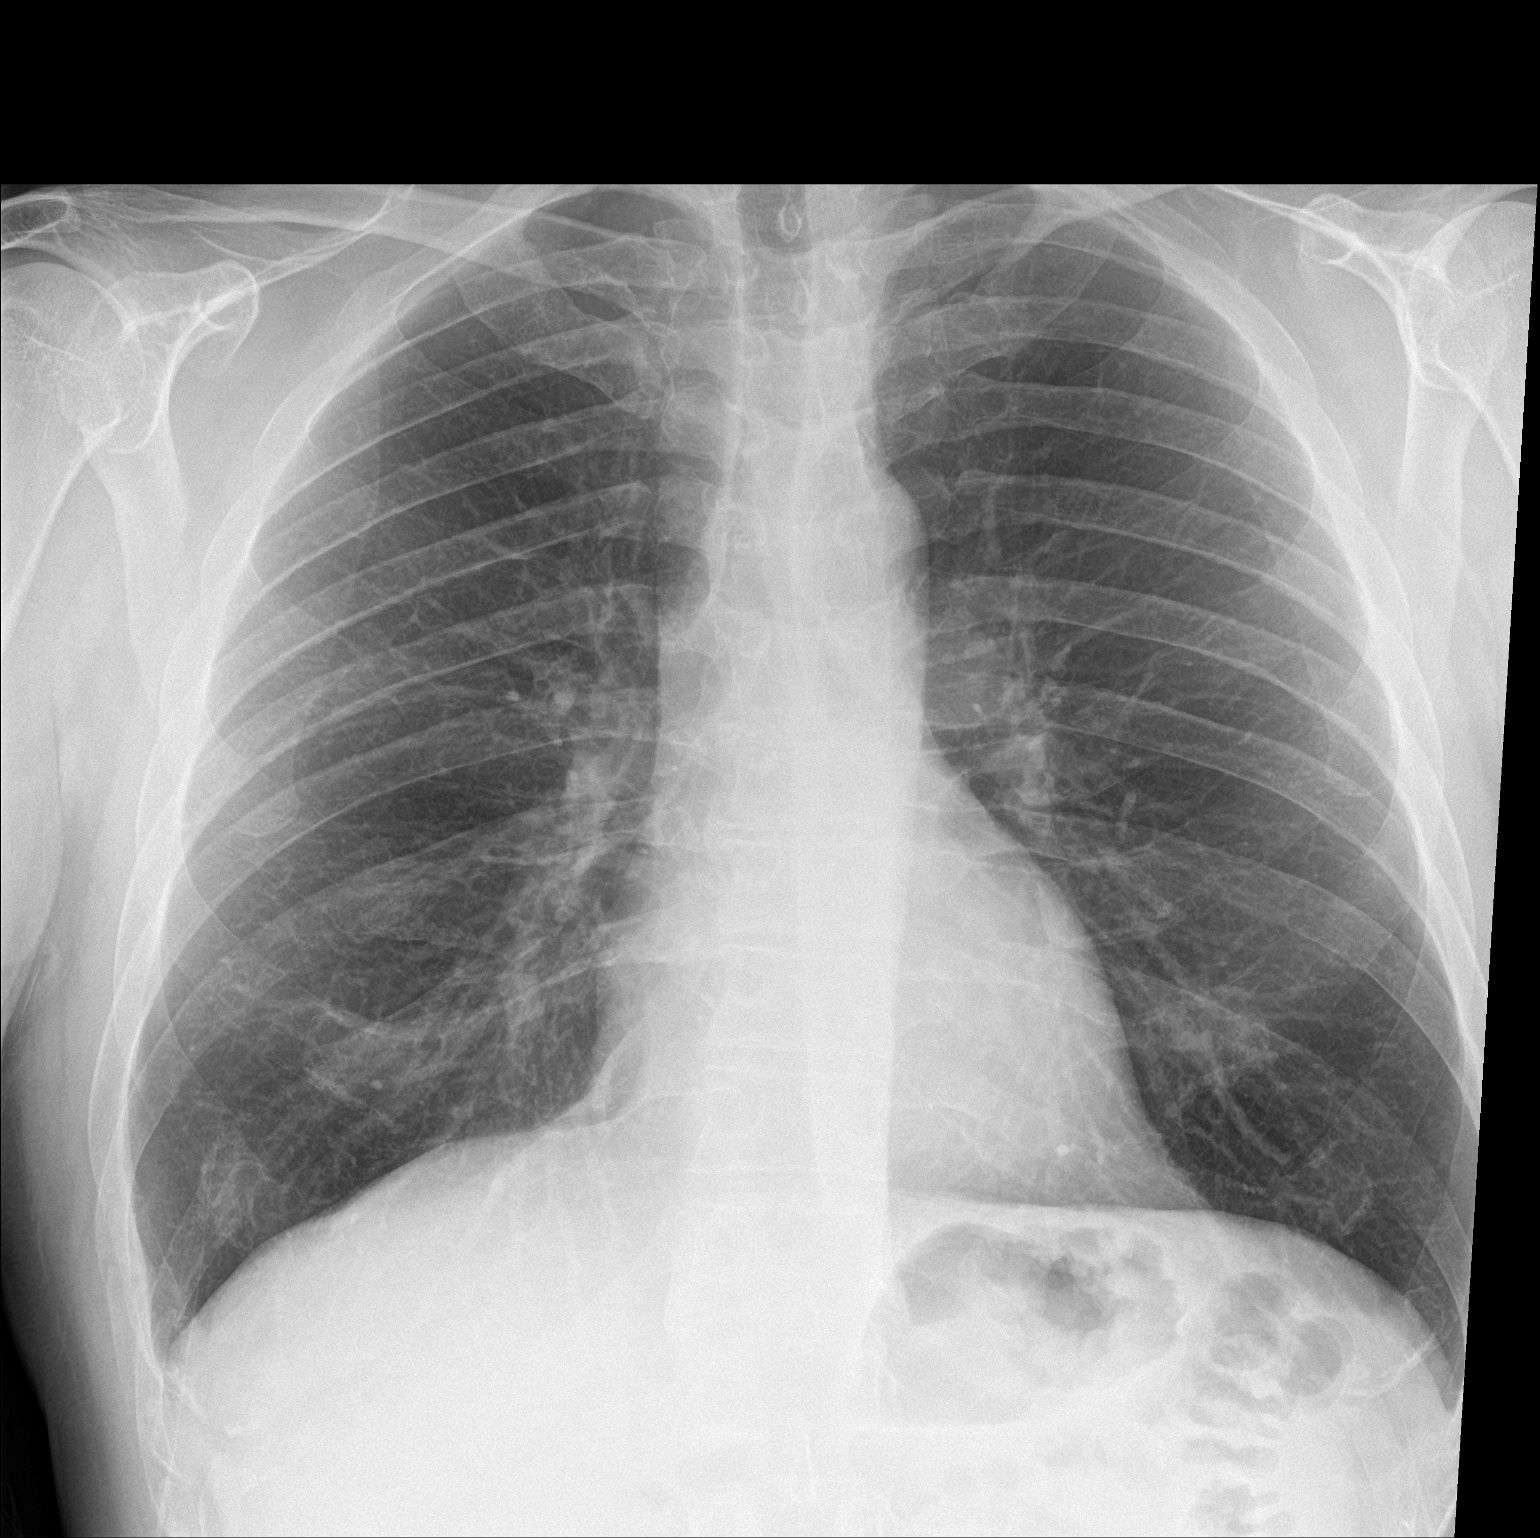

[chest lat]
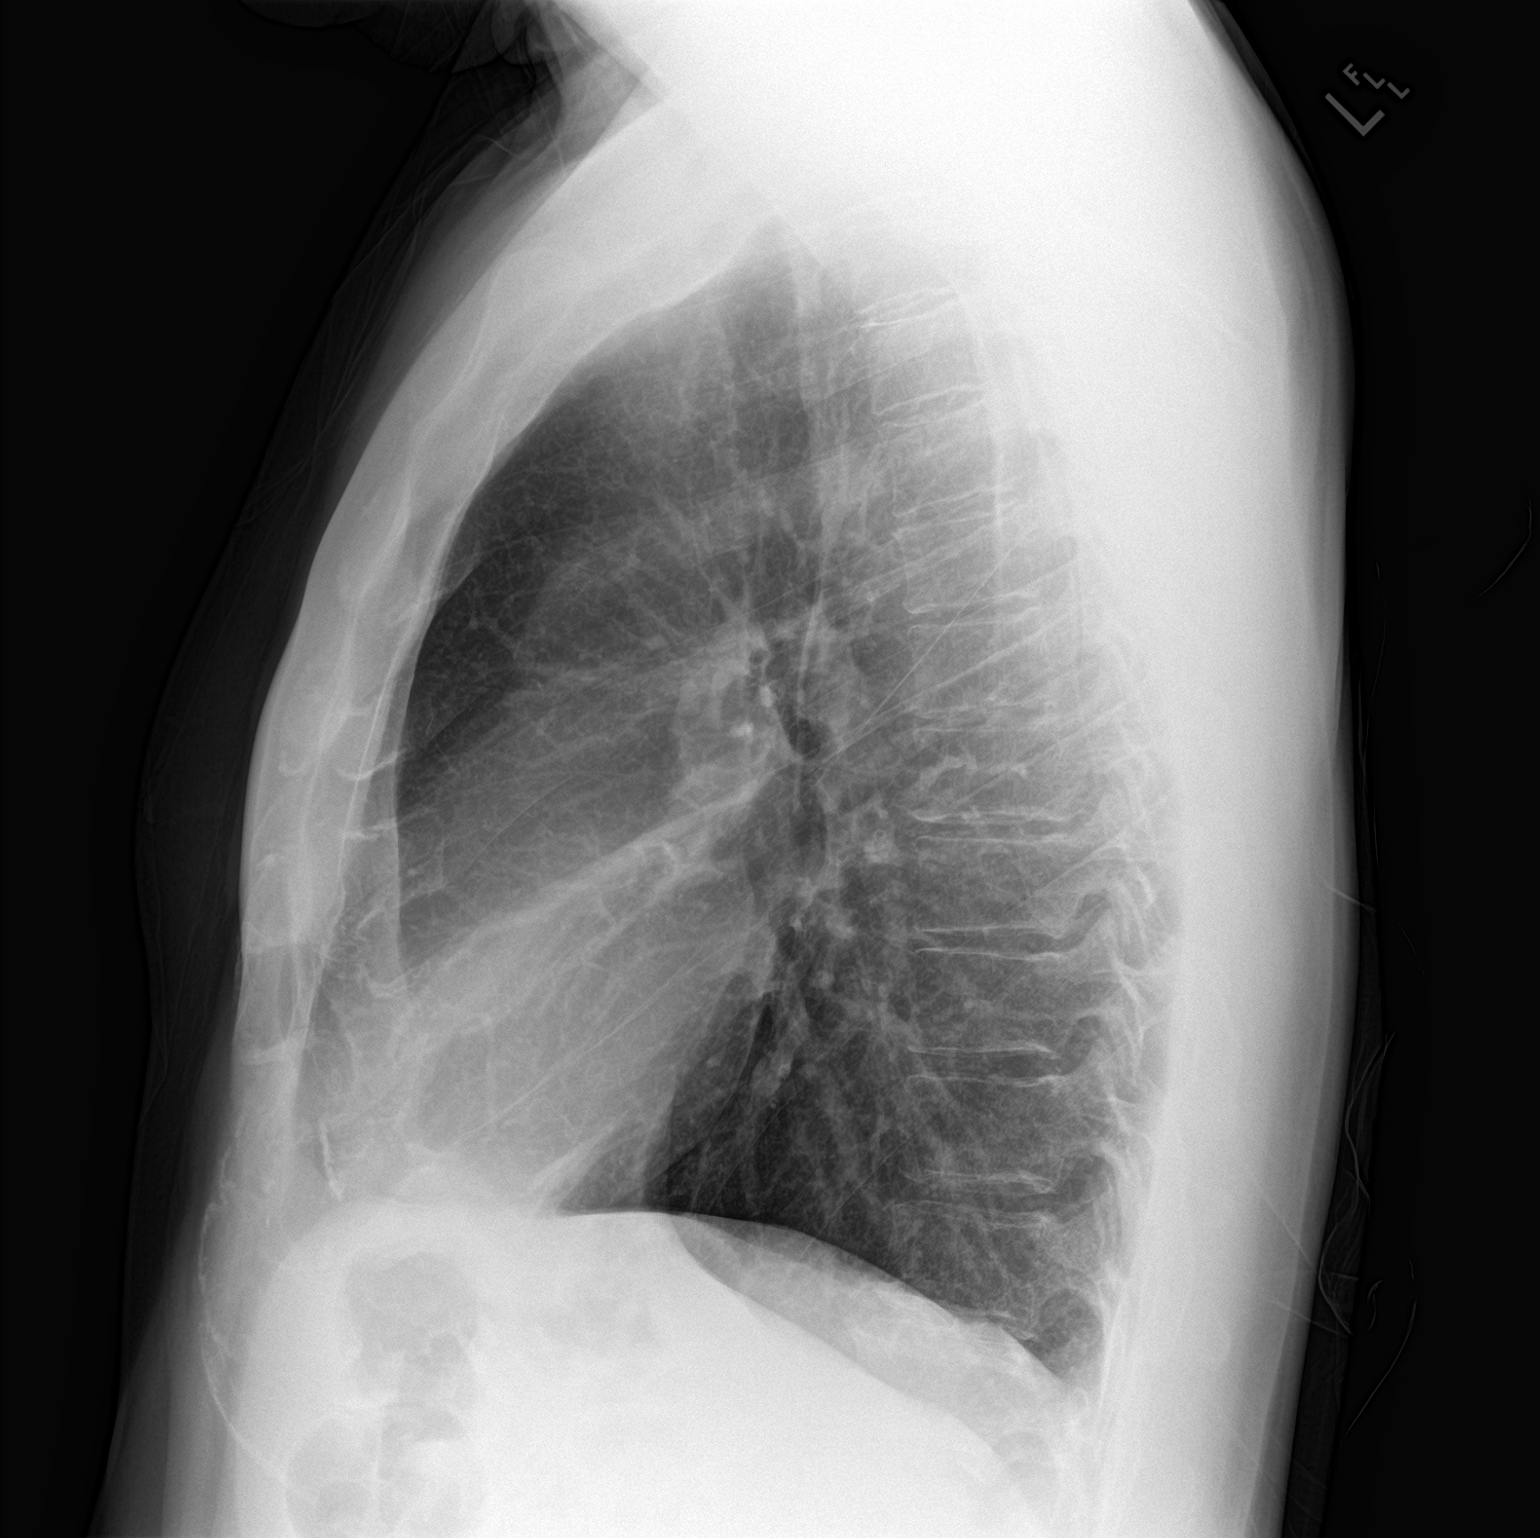

[2 of 2 positions shown; findings below may reference images not displayed]

FINDINGS: A small portion of the lateral left base is not visualized.
Visualized lungs are clear. Heart size and pulmonary vascularity are
normal. No adenopathy. No pneumothorax. There are fractures of the
anterior eighth and ninth ribs.
IMPRESSION: Fractures of the anterior right eighth and ninth ribs. No evident
pneumothorax. No edema or consolidation.

## 2018-08-27 ENCOUNTER — Other Ambulatory Visit: Payer: Self-pay | Admitting: Family Medicine

## 2018-10-02 ENCOUNTER — Other Ambulatory Visit: Payer: Self-pay | Admitting: Family Medicine

## 2018-10-02 NOTE — Telephone Encounter (Signed)
He had may have 30-day, needs a virtual visit

## 2018-10-03 NOTE — Telephone Encounter (Signed)
Left message to return call 

## 2018-10-11 ENCOUNTER — Other Ambulatory Visit: Payer: Self-pay

## 2018-10-11 ENCOUNTER — Ambulatory Visit (INDEPENDENT_AMBULATORY_CARE_PROVIDER_SITE_OTHER): Payer: BLUE CROSS/BLUE SHIELD | Admitting: Family Medicine

## 2018-10-11 DIAGNOSIS — I10 Essential (primary) hypertension: Secondary | ICD-10-CM

## 2018-10-11 MED ORDER — AMLODIPINE BESYLATE 2.5 MG PO TABS: 2.5000 mg | ORAL_TABLET | Freq: Every day | ORAL | 1 refills | Status: DC

## 2018-10-11 MED ORDER — HYDROCHLOROTHIAZIDE 25 MG PO TABS: 25.0000 mg | ORAL_TABLET | Freq: Every day | ORAL | 1 refills | Status: DC

## 2018-10-11 MED ORDER — POTASSIUM CHLORIDE ER 10 MEQ PO TBCR: 10.0000 meq | EXTENDED_RELEASE_TABLET | Freq: Every day | ORAL | 1 refills | Status: DC

## 2018-10-11 NOTE — Progress Notes (Signed)
   Subjective:    Patient ID: Derek Barrett, male    DOB: Jan 15, 1966, 53 y.o.   MRN: [id number] Video and audio Hypertension  This is a chronic problem. The current episode started more than 1 year ago. Risk factors for coronary artery disease include male gender. Treatments tried: hctz 25. There are no compliance problems.    Blood pressure running 140s/90s Blood pressure running higher than what should he states he is trying to eat healthy for the most part could do a little bit more exercise but does do a lot of yard work denies any chest tightness pressure pain shortness of breath does have history of hypertension Virtual Visit via Video Note  I connected with Florentina Addison on 10/11/18 at  9:30 AM EDT by a video enabled telemedicine application and verified that I am speaking with the correct person using two identifiers.  Location: Patient: home Provider: office   I discussed the limitations of evaluation and management by telemedicine and the availability of in person appointments. The patient expressed understanding and agreed to proceed.  History of Present Illness:    Observations/Objective:   Assessment and Plan:   Follow Up Instructions:    I discussed the assessment and treatment plan with the patient. The patient was provided an opportunity to ask questions and all were answered. The patient agreed with the plan and demonstrated an understanding of the instructions.   The patient was advised to call back or seek an in-person evaluation if the symptoms worsen or if the condition fails to improve as anticipated.  I provided 17 minutes of non-face-to-face time during this encounter.       Review of Systems     Objective:   Physical Exam        Assessment & Plan:  HTN subpar control patient will do better job watching diet trying to minimize salt in his diet and try to stay more active  His recent lab work from November looked good there is no  need to do any currently we will do this, November when he does a wellness and chronic health follow-up  With the blood pressure being subpar control will add amlodipine 2.5 mg 1 daily.  Continue his other medicine.

## 2018-10-12 ENCOUNTER — Telehealth: Payer: Self-pay | Admitting: Family Medicine

## 2018-10-12 NOTE — Telephone Encounter (Signed)
[place] because our record shows it was received and confirmed by pharm yesterday at 10:04. [facility] tells me it is ready for pickup. Called pt and notified him.

## 2018-10-12 NOTE — Telephone Encounter (Signed)
Pt is calling stating that the pharmacy did not receive his amLODipine (NORVASC) 2.5 MG tablet

## 2019-04-10 ENCOUNTER — Other Ambulatory Visit: Payer: Self-pay | Admitting: Family Medicine

## 2019-04-11 NOTE — Telephone Encounter (Signed)
May have 90-day does need to do follow-up visit or virtual visit before next refill

## 2019-04-15 NOTE — Telephone Encounter (Signed)
Patient scheduled a visit in November but is out of meds today and would like the refill sent in.

## 2019-04-15 NOTE — Telephone Encounter (Signed)
Left voicemail to call and schedule appt.

## 2019-04-22 ENCOUNTER — Other Ambulatory Visit: Payer: Self-pay | Admitting: *Deleted

## 2019-04-22 DIAGNOSIS — I712 Thoracic aortic aneurysm, without rupture, unspecified: Secondary | ICD-10-CM

## 2019-04-23 ENCOUNTER — Other Ambulatory Visit: Payer: Self-pay | Admitting: Family Medicine

## 2019-04-26 NOTE — Telephone Encounter (Signed)
Please schedule and then route back to nurses to send in  Refill

## 2019-04-26 NOTE — Telephone Encounter (Signed)
Pt already has med check scheduled for 11/23

## 2019-04-26 NOTE — Telephone Encounter (Signed)
May have 90 needs a follow-up visit

## 2019-04-29 ENCOUNTER — Other Ambulatory Visit: Payer: Self-pay

## 2019-04-29 ENCOUNTER — Ambulatory Visit (INDEPENDENT_AMBULATORY_CARE_PROVIDER_SITE_OTHER): Payer: Self-pay | Admitting: Family Medicine

## 2019-04-29 DIAGNOSIS — Z125 Encounter for screening for malignant neoplasm of prostate: Secondary | ICD-10-CM

## 2019-04-29 DIAGNOSIS — E538 Deficiency of other specified B group vitamins: Secondary | ICD-10-CM

## 2019-04-29 DIAGNOSIS — I10 Essential (primary) hypertension: Secondary | ICD-10-CM

## 2019-04-29 DIAGNOSIS — Z720 Tobacco use: Secondary | ICD-10-CM

## 2019-04-29 DIAGNOSIS — E782 Mixed hyperlipidemia: Secondary | ICD-10-CM

## 2019-04-29 MED ORDER — AMLODIPINE BESYLATE 5 MG PO TABS: 5.0000 mg | ORAL_TABLET | Freq: Every day | ORAL | 1 refills | Status: DC

## 2019-04-29 MED ORDER — CHANTIX STARTING MONTH PAK 0.5 MG X 11 & 1 MG X 42 PO TABS: ORAL_TABLET | ORAL | 0 refills | Status: DC

## 2019-04-29 MED ORDER — VARENICLINE TARTRATE 1 MG PO TABS: 1.0000 mg | ORAL_TABLET | Freq: Two times a day (BID) | ORAL | 1 refills | Status: DC

## 2019-04-29 NOTE — Progress Notes (Signed)
   Subjective:    Patient ID: Derek Barrett, male    DOB: 04/09/1966, 53 y.o.   MRN: [id number]  Hypertension This is a chronic problem. The current episode started more than 1 year ago. Pertinent negatives include no chest pain, headaches or shortness of breath. Risk factors for coronary artery disease include male gender. Treatments tried: Norvasc, Hctz. There are no compliance problems.    BP this am 122/87 still running high at times 144/91 highest last week Patient is concerned at times blood pressure running higher than what it should denies any chest pain shortness of breath with it  Patient smokes he knows he needs to quit he would like to try Chantix again he tried it once before it did well with him.  I did go over the warnings regarding Chantix and he agrees to watch closely and stop medicine if any trouble Virtual Visit via Video Note  I connected with RAJAH WHITBY on 04/29/19 at 11:00 AM EST by a video enabled telemedicine application and verified that I am speaking with the correct person using two identifiers.  Location: Patient: home Provider: office   I discussed the limitations of evaluation and management by telemedicine and the availability of in person appointments. The patient expressed understanding and agreed to proceed.  History of Present Illness:    Observations/Objective:   Assessment and Plan:   Follow Up Instructions:    I discussed the assessment and treatment plan with the patient. The patient was provided an opportunity to ask questions and all were answered. The patient agreed with the plan and demonstrated an understanding of the instructions.   The patient was advised to call back or seek an in-person evaluation if the symptoms worsen or if the condition fails to improve as anticipated.  I provided 15 minutes of non-face-to-face time during this encounter.       Review of Systems  Constitutional: Negative for diaphoresis and  fatigue.  HENT: Negative for congestion and rhinorrhea.   Respiratory: Negative for cough and shortness of breath.   Cardiovascular: Negative for chest pain and leg swelling.  Gastrointestinal: Negative for abdominal pain and diarrhea.  Skin: Negative for color change and rash.  Neurological: Negative for dizziness and headaches.  Psychiatric/Behavioral: Negative for behavioral problems and confusion.       Objective:   Physical Exam  Patient had virtual visit Appears to be in no distress Atraumatic Neuro able to relate and oriented No apparent resp distress Color normal      Assessment & Plan:  HTN fair control could be better therefore bump up the dose of the medication.  Amlodipine new dose 5 mg continue other medicines as is  Chantix as discussed for the next 3 months warning signs if severe disturbing dreams depression or suicidal ideation immediately stop medicine and notify us at once  We will pursue colonoscopy in the spring he would like to avoid this currently during the pandemic

## 2019-04-30 NOTE — Progress Notes (Signed)
Lab orders mailed to patient.

## 2019-04-30 NOTE — Progress Notes (Signed)
That would be fine the patient can do his lab work around Derek Barrett -feel free to print the orders mailed to him with the notation for him to do those in April/early Barrett

## 2019-04-30 NOTE — Addendum Note (Signed)
Addended by: Vicente Males on: 04/30/2019 08:42 AM   Modules accepted: Orders

## 2019-04-30 NOTE — Progress Notes (Signed)
Labs orders placed and pt is aware. Pt states that provider told him to have labs done around springtime. Please advise. Thank you

## 2019-05-06 ENCOUNTER — Other Ambulatory Visit: Payer: Self-pay | Admitting: Family Medicine

## 2019-05-15 ENCOUNTER — Other Ambulatory Visit: Payer: BLUE CROSS/BLUE SHIELD

## 2019-05-15 ENCOUNTER — Encounter: Payer: BLUE CROSS/BLUE SHIELD | Admitting: Surgery

## 2019-06-19 ENCOUNTER — Telehealth: Payer: Self-pay | Admitting: Family Medicine

## 2019-06-19 NOTE — Telephone Encounter (Signed)
Please advise. Thank you

## 2019-06-19 NOTE — Telephone Encounter (Signed)
Patient mailed in his blood pressure reading for you to review in your folder.

## 2019-06-20 NOTE — Telephone Encounter (Signed)
Please let the patient know that I reviewed over the blood pressure readings he submitted (Initially on 30 November 137/88, December 1 136/80, subsequent readings all look good December 10 was 144/86 December 13 128/82)  Please inform the patient that I feel his readings overall look good as long as they stay for the most part below 140 on the top number and below 90 on the bottom number.  Patient should continue to try to eat healthy stay active minimize salt avoid smoking Finally the patient should do a follow-up visit in the late spring/May-June

## 2019-06-21 NOTE — Telephone Encounter (Signed)
Left message to return call 

## 2019-07-29 ENCOUNTER — Other Ambulatory Visit: Payer: Self-pay | Admitting: Family Medicine

## 2019-08-09 ENCOUNTER — Telehealth: Payer: Self-pay | Admitting: Family Medicine

## 2019-08-09 ENCOUNTER — Other Ambulatory Visit: Payer: Self-pay | Admitting: *Deleted

## 2019-08-09 MED ORDER — POTASSIUM CHLORIDE ER 10 MEQ PO TBCR: 10.0000 meq | EXTENDED_RELEASE_TABLET | Freq: Every day | ORAL | 0 refills | Status: DC

## 2019-08-09 NOTE — Telephone Encounter (Signed)
Refill sent per dr Richardson Landry and pt was notified.

## 2019-08-09 NOTE — Telephone Encounter (Signed)
Pt needs refill on his potassium chloride (KLOR-CON) 10 MEQ tablet   Please advise & call pt   [employer]

## 2019-10-24 ENCOUNTER — Other Ambulatory Visit: Payer: Self-pay | Admitting: Family Medicine

## 2019-10-29 ENCOUNTER — Telehealth: Payer: Self-pay | Admitting: Family Medicine

## 2019-10-29 MED ORDER — HYDROCHLOROTHIAZIDE 25 MG PO TABS: 25.0000 mg | ORAL_TABLET | Freq: Every day | ORAL | 0 refills | Status: DC

## 2019-10-29 MED ORDER — POTASSIUM CHLORIDE ER 10 MEQ PO TBCR: 10.0000 meq | EXTENDED_RELEASE_TABLET | Freq: Every day | ORAL | 0 refills | Status: DC

## 2019-10-29 NOTE — Telephone Encounter (Signed)
Patient is requesting refills on hydrochlorothiazide 25 mg and potassium chloride 10 mg. He has scheduled his medication follow up for 6/15. [employer]

## 2019-11-05 ENCOUNTER — Other Ambulatory Visit: Payer: Self-pay | Admitting: Family Medicine

## 2019-11-19 ENCOUNTER — Telehealth (INDEPENDENT_AMBULATORY_CARE_PROVIDER_SITE_OTHER): Payer: BC Managed Care – PPO | Admitting: Family Medicine

## 2019-11-19 ENCOUNTER — Telehealth: Payer: Self-pay | Admitting: *Deleted

## 2019-11-19 ENCOUNTER — Other Ambulatory Visit: Payer: Self-pay

## 2019-11-19 VITALS — BP 132/86

## 2019-11-19 DIAGNOSIS — I10 Essential (primary) hypertension: Secondary | ICD-10-CM

## 2019-11-19 DIAGNOSIS — E782 Mixed hyperlipidemia: Secondary | ICD-10-CM

## 2019-11-19 DIAGNOSIS — Z1211 Encounter for screening for malignant neoplasm of colon: Secondary | ICD-10-CM

## 2019-11-19 DIAGNOSIS — Z720 Tobacco use: Secondary | ICD-10-CM | POA: Diagnosis not present

## 2019-11-19 DIAGNOSIS — Z125 Encounter for screening for malignant neoplasm of prostate: Secondary | ICD-10-CM | POA: Diagnosis not present

## 2019-11-19 DIAGNOSIS — L989 Disorder of the skin and subcutaneous tissue, unspecified: Secondary | ICD-10-CM

## 2019-11-19 MED ORDER — POTASSIUM CHLORIDE ER 10 MEQ PO TBCR: 10.0000 meq | EXTENDED_RELEASE_TABLET | Freq: Every day | ORAL | 1 refills | Status: DC

## 2019-11-19 MED ORDER — AMLODIPINE BESYLATE 5 MG PO TABS: 5.0000 mg | ORAL_TABLET | Freq: Every day | ORAL | 1 refills | Status: DC

## 2019-11-19 MED ORDER — HYDROCHLOROTHIAZIDE 25 MG PO TABS: 25.0000 mg | ORAL_TABLET | Freq: Every day | ORAL | 1 refills | Status: DC

## 2019-11-19 NOTE — Addendum Note (Signed)
Addended by: Dairl Ponder on: 11/19/2019 01:41 PM   Modules accepted: Orders

## 2019-11-19 NOTE — Telephone Encounter (Signed)
Mr. Derek Barrett, Derek Barrett are scheduled for a virtual visit with your provider today.  Consent was obtained before telehealth appointment Just as we do with appointments in the office, we must obtain your consent to participate.  Your consent will be active for this visit and any virtual visit you may have with one of our providers in the next 365 days.    If you have a MyChart account, I can also send a copy of this consent to you electronically.  All virtual visits are billed to your insurance company just like a traditional visit in the office.  As this is a virtual visit, video technology does not allow for your provider to perform a traditional examination.  This may limit your provider's ability to fully assess your condition.  If your provider identifies any concerns that need to be evaluated in person or the need to arrange testing such as labs, EKG, etc, we will make arrangements to do so.    Although advances in technology are sophisticated, we cannot ensure that it will always work on either your end or our end.  If the connection with a video visit is poor, we may have to switch to a telephone visit.  With either a video or telephone visit, we are not always able to ensure that we have a secure connection.   I need to obtain your verbal consent now.   Are you willing to proceed with your visit today?   Derek Barrett has provided verbal consent on 11/19/2019 for a virtual visit (video or telephone).   Mitzie Na, RN 11/19/2019

## 2019-11-19 NOTE — Progress Notes (Signed)
   Subjective:    Patient ID: Derek Barrett, male    DOB: 21-Feb-1966, 54 y.o.   MRN: [id number]  Hypertension This is a chronic problem. The current episode started more than 1 year ago. Risk factors for coronary artery disease include male gender. Treatments tried: norvasc, hctz. There are no compliance problems.    Patient is due for blood work Patient is working on quitting smoking.  Patient not taking Chantix yet but is aware of potential side effects  We did discuss healthy diet regular physical activity    Review of Systems  Virtual Visit via Video Note  I connected with LETRELL ATTWOOD on 11/19/19 at  8:20 AM EDT by a video enabled telemedicine application and verified that I am speaking with the correct person using two identifiers.  Location: Patient: home Provider: office   I discussed the limitations of evaluation and management by telemedicine and the availability of in person appointments. The patient expressed understanding and agreed to proceed.  History of Present Illness:    Observations/Objective:   Assessment and Plan:   Follow Up Instructions:    I discussed the assessment and treatment plan with the patient. The patient was provided an opportunity to ask questions and all were answered. The patient agreed with the plan and demonstrated an understanding of the instructions.   The patient was advised to call back or seek an in-person evaluation if the symptoms worsen or if the condition fails to improve as anticipated.  I provided 20 minutes of non-face-to-face time during this encounter.  Including documenting        Objective:   Physical Exam  Patient had virtual visit Appears to be in no distress Atraumatic Neuro able to relate and oriented No apparent resp distress Color normal       Assessment & Plan:  Essential hypertension  Tobacco abuse  Mixed hyperlipidemia  Screening for prostate cancer  Skin lesion 1.  Essential hypertension Blood pressure good control couple times it was elevated I have encouraged him to start doing some walking for exercise also encouraged him to minimize salt in the diet.  Continue his medicines  2. Tobacco abuse Patient states he set up a quit date for later in July and he will start the Chantix at that time is aware of how to properly take it  3. Mixed hyperlipidemia Due for lab work lab work ordered watch diet  4. Screening for prostate cancer Due for lab work  5. Skin lesion Has a small spot on his arm cannot see it well through the video patient will drop by summer in the next couple weeks as a courtesy visit we will take a look at this spot on the arm (that way we can help determine if he needs to go see dermatology or just watch it)  Follow-up in person later this fall for a wellness/follow-up on blood pressure

## 2019-11-25 ENCOUNTER — Encounter: Payer: Self-pay | Admitting: Family Medicine

## 2019-12-02 ENCOUNTER — Encounter: Payer: Self-pay | Admitting: Internal Medicine

## 2020-01-23 ENCOUNTER — Ambulatory Visit: Payer: BLUE CROSS/BLUE SHIELD

## 2020-02-04 ENCOUNTER — Other Ambulatory Visit: Payer: Self-pay

## 2020-02-04 ENCOUNTER — Ambulatory Visit (INDEPENDENT_AMBULATORY_CARE_PROVIDER_SITE_OTHER): Payer: BC Managed Care – PPO | Admitting: Family Medicine

## 2020-02-04 ENCOUNTER — Ambulatory Visit (HOSPITAL_COMMUNITY)
Admission: RE | Admit: 2020-02-04 | Discharge: 2020-02-04 | Disposition: A | Discharge status: Home / Self Care | Payer: BC Managed Care – PPO | Source: Ambulatory Visit | Attending: Family Medicine | Admitting: Family Medicine

## 2020-02-04 DIAGNOSIS — R05 Cough: Secondary | ICD-10-CM | POA: Insufficient documentation

## 2020-02-04 DIAGNOSIS — J441 Chronic obstructive pulmonary disease with (acute) exacerbation: Secondary | ICD-10-CM

## 2020-02-04 DIAGNOSIS — R0602 Shortness of breath: Secondary | ICD-10-CM

## 2020-02-04 DIAGNOSIS — R059 Cough, unspecified: Secondary | ICD-10-CM

## 2020-02-04 MED ORDER — AZITHROMYCIN 250 MG PO TABS: ORAL_TABLET | ORAL | 0 refills | Status: DC

## 2020-02-04 MED ORDER — ALBUTEROL SULFATE HFA 108 (90 BASE) MCG/ACT IN AERS: 2.0000 | INHALATION_SPRAY | RESPIRATORY_TRACT | 2 refills | Status: DC | PRN

## 2020-02-04 MED ORDER — PREDNISONE 20 MG PO TABS: ORAL_TABLET | ORAL | 0 refills | Status: DC

## 2020-02-04 MED ORDER — METHYLPREDNISOLONE ACETATE 40 MG/ML IJ SUSP
40.0000 mg | Freq: Once | INTRAMUSCULAR | Status: AC
  Administered 2020-02-04: 40 mg via INTRAMUSCULAR

## 2020-02-04 NOTE — Progress Notes (Signed)
   Subjective:    Patient ID: Derek Barrett, male    DOB: 1966/02/21, 54 y.o.   MRN: [id number]  HPI  Patient with cough for a while but worse with SOB for a few days. Patient with progressive chest congestion coughing over the past several weeks.  Over the past few days difficult to the point of having a hard time breathing when he moves about.  Denies any high fever chills sweats denies passing out denies muscle aches headaches denies being exposed to Covid patient has had his vaccine. Review of Systems  Constitutional: Negative for activity change, chills and fever.  HENT: Positive for congestion and rhinorrhea. Negative for ear pain.   Eyes: Negative for discharge.  Respiratory: Positive for cough. Negative for wheezing.   Cardiovascular: Negative for chest pain.  Gastrointestinal: Negative for nausea and vomiting.  Musculoskeletal: Negative for arthralgias.       Objective:   Physical Exam Vitals and nursing note reviewed.  Constitutional:      Appearance: He is well-developed.  HENT:     Head: Normocephalic.     Mouth/Throat:     Pharynx: No oropharyngeal exudate.  Cardiovascular:     Rate and Rhythm: Normal rate and regular rhythm.     Heart sounds: Normal heart sounds. No murmur heard.   Pulmonary:     Effort: Pulmonary effort is normal.     Breath sounds: Normal breath sounds. No wheezing.  Musculoskeletal:     Cervical back: Normal range of motion.  Lymphadenopathy:     Cervical: No cervical adenopathy.  Skin:    General: Skin is warm and dry.  Neurological:     Motor: No abnormal muscle tone.           Assessment & Plan:  Probable COPD exacerbation Depo-Medrol shot Covid test taken Prednisone taper Z-Pak as directed Albuterol 2 puffs every 1-2 hours until relief then backing off to every 2-4 hours as needed warning signs were discussed O2 saturation 91% patient encouraged to get O2 sat meter and follow his oxygens over the next few days if they  get down into the 80s he needs to go to the ER patient to follow-up with [place] in several weeks

## 2020-02-05 LAB — NOVEL CORONAVIRUS, NAA: SARS-CoV-2, NAA: NOT DETECTED

## 2020-02-12 ENCOUNTER — Telehealth: Payer: Self-pay | Admitting: Family Medicine

## 2020-02-12 MED ORDER — ALBUTEROL SULFATE HFA 108 (90 BASE) MCG/ACT IN AERS
2.0000 | INHALATION_SPRAY | RESPIRATORY_TRACT | 2 refills | Status: DC | PRN
Start: 2020-02-12 — End: 2020-07-28

## 2020-02-12 NOTE — Telephone Encounter (Signed)
Wants refill on Albuterol inhaler, changing pharmacy to [facility] in [place].

## 2020-02-12 NOTE — Telephone Encounter (Signed)
Pt returned call and verbalized understanding  

## 2020-02-12 NOTE — Telephone Encounter (Signed)
Albuterol inhaler sent to [facility]. Left message to return call

## 2020-03-03 ENCOUNTER — Ambulatory Visit (INDEPENDENT_AMBULATORY_CARE_PROVIDER_SITE_OTHER): Payer: Self-pay | Admitting: *Deleted

## 2020-03-03 ENCOUNTER — Other Ambulatory Visit: Payer: Self-pay

## 2020-03-03 DIAGNOSIS — Z1211 Encounter for screening for malignant neoplasm of colon: Secondary | ICD-10-CM

## 2020-03-03 NOTE — Progress Notes (Addendum)
Gastroenterology Pre-Procedure Review  Request Date: 03/03/2020 Requesting Physician: Dr. Sallee Lange, no previous TCS  PATIENT REVIEW QUESTIONS: The patient responded to the following health history questions as indicated:    1. Diabetes Melitis: no 2. Joint replacements in the past 12 months: no 3. Major health problems in the past 3 months: no 4. Has an artificial valve or MVP: no 5. Has a defibrillator: no 6. Has been advised in past to take antibiotics in advance of a procedure like teeth cleaning: no 7. Family history of colon cancer: no  8. Alcohol Use: yes, 2 to 3 beers daily 9. Illicit drug Use: no 10. History of sleep apnea: no  11. History of coronary artery or other vascular stents placed within the last 12 months: no 12. History of any prior anesthesia complications: no 13. There is no height or weight on file to calculate BMI. ht: 5'9 wt: 190 lbs    MEDICATIONS & ALLERGIES:    Patient reports the following regarding taking any blood thinners:   Plavix? no Aspirin? no Coumadin? no Brilinta? no Xarelto? no Eliquis? no Pradaxa? no Savaysa? no Effient? no  Patient confirms/reports the following medications:  Current Outpatient Medications  Medication Sig Dispense Refill  . albuterol (VENTOLIN HFA) 108 (90 Base) MCG/ACT inhaler Inhale 2 puffs into the lungs every 4 (four) hours as needed for wheezing. (Patient taking differently: Inhale 2 puffs into the lungs as needed for wheezing. ) 1 each 2  . amLODipine (NORVASC) 5 MG tablet Take 1 tablet (5 mg total) by mouth daily. 90 tablet 1  . hydrochlorothiazide (HYDRODIURIL) 25 MG tablet Take 1 tablet (25 mg total) by mouth daily. 90 tablet 1  . potassium chloride (KLOR-CON) 10 MEQ tablet Take 1 tablet (10 mEq total) by mouth daily. 90 tablet 1  . vitamin B-12 (CYANOCOBALAMIN) 500 MCG tablet Take 500 mcg by mouth daily.     No current facility-administered medications for this visit.    Patient confirms/reports the  following allergies:  Allergies  Allergen Reactions  . Penicillins     No orders of the defined types were placed in this encounter.   AUTHORIZATION INFORMATION Primary Insurance: [place],  [place] [id number],  Group #: [id number] Pre-Cert / Josem Kaufmann required: No, not required  SCHEDULE INFORMATION: Procedure has been scheduled as follows:  Date: 04/03/2020, Time: 10:00 Location: [facility] with Dr. Abbey Chatters  This Gastroenterology Pre-Precedure Review Form is being routed to the following provider(s): Roseanne Kaufman, NP

## 2020-03-11 MED ORDER — PEG 3350-KCL-NA BICARB-NACL 420 G PO SOLR: 4000.0000 mL | Freq: Once | ORAL | 0 refills | Status: AC

## 2020-03-11 NOTE — Addendum Note (Signed)
Addended by: Metro Kung on: 03/11/2020 02:55 PM   Modules accepted: Orders

## 2020-03-11 NOTE — Progress Notes (Signed)
Appropriate. ASA 2.  

## 2020-03-11 NOTE — Patient Instructions (Signed)
Derek Barrett   23-Jan-1966 MRN: [id number]    Procedure Date: 04/03/2020 Time to register: 8:30 am Place to register: [facility] Procedure Time: 10:00 am Scheduled provider: Dr. Abbey Chatters  PREPARATION FOR COLONOSCOPY WITH TRI-LYTE SPLIT PREP  Please notify us immediately if you are diabetic, take iron supplements, or if you are on Coumadin or any other blood thinners.   Please hold the following medications: n/a  You will need to purchase 1 fleet enema and 1 box of Bisacodyl $RemoveBefo'5mg'LbBkqgqQfQB$  tablets.   2 DAYS BEFORE PROCEDURE:  DATE: 04/01/2020   DAY: Wednesday Begin clear liquid diet AFTER your lunch meal. NO SOLID FOODS after this point.  1 DAY BEFORE PROCEDURE:  DATE: 04/02/2020   DAY: Thursday Continue clear liquids the entire day - NO SOLID FOOD.   Diabetic medications adjustments for today: n/a  At 2:00 pm:  Take 2 Bisacodyl tablets.   At 4:00pm:  Start drinking your solution. Make sure you mix well per instructions on the bottle. Try to drink 1 (one) 8 ounce glass every 10-15 minutes until you have consumed HALF the jug. You should complete by 6:00pm.You must keep the left over solution refrigerated until completed next day.  Continue clear liquids. You must drink plenty of clear liquids to prevent dehyration and kidney failure.     DAY OF PROCEDURE:   DATE: 04/03/2020   DAY: Friday If you take medications for your heart, blood pressure or breathing, you may take these medications.  Diabetic medications adjustments for today: n/a  Five hours before your procedure time @ 5:00 am:  Finish remaining amout of bowel prep, drinking 1 (one) 8 ounce glass every 10-15 minutes until complete. You have two hours to consume remaining prep.   Three hours before your procedure time @ 7:00 am:  Nothing by mouth.   At least one hour before going to the hospital:  Give yourself one Fleet enema. You may take your morning medications with sip of water unless we have instructed  otherwise.      Please see below for Dietary Information.  CLEAR LIQUIDS INCLUDE:  Water Jello (NOT red in color)   Ice Popsicles (NOT red in color)   Tea (sugar ok, no milk/cream) Powdered fruit flavored drinks  Coffee (sugar ok, no milk/cream) Gatorade/ Lemonade/ Kool-Aid  (NOT red in color)   Juice: apple, white grape, white cranberry Soft drinks  Clear bullion, consomme, broth (fat free beef/chicken/vegetable)  Carbonated beverages (any kind)  Strained chicken noodle soup Hard Candy   Remember: Clear liquids are liquids that will allow you to see your fingers on the other side of a clear glass. Be sure liquids are NOT red in color, and not cloudy, but CLEAR.  DO NOT EAT OR DRINK ANY OF THE FOLLOWING:  Dairy products of any kind   Cranberry juice Tomato juice / V8 juice   Grapefruit juice Orange juice     Red grape juice  Do not eat any solid foods, including such foods as: cereal, oatmeal, yogurt, fruits, vegetables, creamed soups, eggs, bread, crackers, pureed foods in a blender, etc.   HELPFUL HINTS FOR DRINKING PREP SOLUTION:   Make sure prep is extremely cold. Mix and refrigerate the the morning of the prep. You may also put in the freezer.   You may try mixing some Crystal Light or Country Time Lemonade if you prefer. Mix in small amounts; add more if necessary.  Try drinking through a straw  Rinse mouth with water  or a mouthwash between glasses, to remove after-taste.  Try sipping on a cold beverage /ice/ popsicles between glasses of prep.  Place a piece of sugar-free hard candy in mouth between glasses.  If you become nauseated, try consuming smaller amounts, or stretch out the time between glasses. Stop for 30-60 minutes, then slowly start back drinking.        OTHER INSTRUCTIONS  You will need a responsible adult at least 54 years of age to accompany you and drive you home. This person must remain in the waiting room during your procedure. The hospital  will cancel your procedure if you do not have a responsible adult with you.   1. Wear loose fitting clothing that is easily removed. 2. Leave jewelry and other valuables at home.  3. Remove all body piercing jewelry and leave at home. 4. Total time from sign-in until discharge is approximately 2-3 hours. 5. You should go home directly after your procedure and rest. You can resume normal activities the day after your procedure. 6. The day of your procedure you should not:  Drive  Make legal decisions  Operate machinery  Drink alcohol  Return to work   You may call the office (Dept: [phone]) before 5:00pm, or page the doctor on call [phone]) after 5:00pm, for further instructions, if necessary.   Insurance Information YOU WILL NEED TO CHECK WITH YOUR INSURANCE COMPANY FOR THE BENEFITS OF COVERAGE YOU HAVE FOR THIS PROCEDURE.  UNFORTUNATELY, NOT ALL INSURANCE COMPANIES HAVE BENEFITS TO COVER ALL OR PART OF THESE TYPES OF PROCEDURES.  IT IS YOUR RESPONSIBILITY TO CHECK YOUR BENEFITS, HOWEVER, WE WILL BE GLAD TO ASSIST YOU WITH ANY CODES YOUR INSURANCE COMPANY MAY NEED.    PLEASE NOTE THAT MOST INSURANCE COMPANIES WILL NOT COVER A SCREENING COLONOSCOPY FOR PEOPLE UNDER THE AGE OF 50  IF YOU HAVE BCBS INSURANCE, YOU MAY HAVE BENEFITS FOR A SCREENING COLONOSCOPY BUT IF POLYPS ARE FOUND THE DIAGNOSIS WILL CHANGE AND THEN YOU MAY HAVE A DEDUCTIBLE THAT WILL NEED TO BE MET. SO PLEASE MAKE SURE YOU CHECK YOUR BENEFITS FOR A SCREENING COLONOSCOPY AS WELL AS A DIAGNOSTIC COLONOSCOPY.

## 2020-03-31 ENCOUNTER — Encounter (HOSPITAL_COMMUNITY): Payer: Self-pay

## 2020-04-01 ENCOUNTER — Other Ambulatory Visit (HOSPITAL_COMMUNITY)
Admission: RE | Admit: 2020-04-01 | Discharge: 2020-04-01 | Disposition: A | Discharge status: Home / Self Care | Payer: BC Managed Care – PPO | Source: Ambulatory Visit | Attending: Internal Medicine | Admitting: Internal Medicine

## 2020-04-01 ENCOUNTER — Other Ambulatory Visit: Payer: Self-pay

## 2020-04-01 DIAGNOSIS — Z01812 Encounter for preprocedural laboratory examination: Secondary | ICD-10-CM | POA: Insufficient documentation

## 2020-04-01 DIAGNOSIS — Z20822 Contact with and (suspected) exposure to covid-19: Secondary | ICD-10-CM | POA: Insufficient documentation

## 2020-04-01 LAB — BASIC METABOLIC PANEL
Anion gap: 8 (ref 5–15)
BUN: 17 mg/dL (ref 6–20)
CO2: 27 mmol/L (ref 22–32)
Calcium: 9.4 mg/dL (ref 8.9–10.3)
Chloride: 102 mmol/L (ref 98–111)
Creatinine, Ser: 0.87 mg/dL (ref 0.61–1.24)
GFR, Estimated: >60 mL/min (ref >60)
Glucose, Bld: 116 mg/dL — ABNORMAL HIGH (ref 70–99)
Potassium: 4 mmol/L (ref 3.5–5.1)
Sodium: 137 mmol/L (ref 135–145)

## 2020-04-01 LAB — SARS CORONAVIRUS 2 (TAT 6-24 HRS): SARS Coronavirus 2: NEGATIVE

## 2020-04-03 ENCOUNTER — Other Ambulatory Visit: Payer: Self-pay

## 2020-04-03 ENCOUNTER — Encounter (HOSPITAL_COMMUNITY): Payer: Self-pay

## 2020-04-03 ENCOUNTER — Encounter (HOSPITAL_COMMUNITY)
Admission: RE | Disposition: A | Discharge status: Home / Self Care | Payer: Self-pay | Source: Home / Self Care | Attending: Internal Medicine

## 2020-04-03 ENCOUNTER — Ambulatory Visit (HOSPITAL_COMMUNITY): Payer: BC Managed Care – PPO | Admitting: Anesthesiology

## 2020-04-03 ENCOUNTER — Ambulatory Visit (HOSPITAL_COMMUNITY)
Admission: RE | Admit: 2020-04-03 | Discharge: 2020-04-03 | Disposition: A | Discharge status: Home / Self Care | Payer: BC Managed Care – PPO | Attending: Internal Medicine | Admitting: Internal Medicine

## 2020-04-03 DIAGNOSIS — K635 Polyp of colon: Secondary | ICD-10-CM | POA: Diagnosis not present

## 2020-04-03 DIAGNOSIS — K5730 Diverticulosis of large intestine without perforation or abscess without bleeding: Secondary | ICD-10-CM | POA: Insufficient documentation

## 2020-04-03 DIAGNOSIS — Z87891 Personal history of nicotine dependence: Secondary | ICD-10-CM | POA: Insufficient documentation

## 2020-04-03 DIAGNOSIS — D123 Benign neoplasm of transverse colon: Secondary | ICD-10-CM | POA: Insufficient documentation

## 2020-04-03 DIAGNOSIS — Z1211 Encounter for screening for malignant neoplasm of colon: Secondary | ICD-10-CM | POA: Insufficient documentation

## 2020-04-03 DIAGNOSIS — K648 Other hemorrhoids: Secondary | ICD-10-CM | POA: Insufficient documentation

## 2020-04-03 DIAGNOSIS — K634 Enteroptosis: Secondary | ICD-10-CM | POA: Diagnosis not present

## 2020-04-03 HISTORY — PX: POLYPECTOMY: SHX5525

## 2020-04-03 HISTORY — DX: Essential (primary) hypertension: I10

## 2020-04-03 HISTORY — PX: COLONOSCOPY WITH PROPOFOL: SHX5780

## 2020-04-03 SURGERY — COLONOSCOPY WITH PROPOFOL
Anesthesia: General

## 2020-04-03 MED ORDER — CHLORHEXIDINE GLUCONATE CLOTH 2 % EX PADS: 6.0000 | MEDICATED_PAD | Freq: Once | CUTANEOUS | Status: DC

## 2020-04-03 MED ORDER — STERILE WATER FOR IRRIGATION IR SOLN
Status: DC | PRN
  Administered 2020-04-03: 1.5 mL

## 2020-04-03 MED ORDER — PROPOFOL 10 MG/ML IV BOLUS
INTRAVENOUS | Status: DC | PRN
  Administered 2020-04-03: 20 mg via INTRAVENOUS
  Administered 2020-04-03: 50 mg via INTRAVENOUS
  Administered 2020-04-03: 30 mg via INTRAVENOUS
  Administered 2020-04-03: 50 mg via INTRAVENOUS
  Administered 2020-04-03: 20 mg via INTRAVENOUS
  Administered 2020-04-03: 50 mg via INTRAVENOUS

## 2020-04-03 MED ORDER — LACTATED RINGERS IV SOLN
INTRAVENOUS | Status: DC
  Administered 2020-04-03: 1000 mL via INTRAVENOUS

## 2020-04-03 MED ORDER — PROPOFOL 500 MG/50ML IV EMUL
INTRAVENOUS | Status: DC | PRN
  Administered 2020-04-03: 150 ug/kg/min via INTRAVENOUS

## 2020-04-03 MED ORDER — PROPOFOL 10 MG/ML IV BOLUS
INTRAVENOUS | Status: AC
  Filled 2020-04-03: qty 60

## 2020-04-03 NOTE — Op Note (Signed)
[facility] Patient Name: Derek Barrett Procedure Date: 04/03/2020 9:21 AM MRN: [id number] Date of Birth: November 19, 1965 Attending MD: Elon Alas. Edgar Frisk CSN: [id number] Age: 54 Admit Type: Outpatient Procedure:                Colonoscopy Indications:              Screening for colorectal malignant neoplasm Providers:                Elon Alas. Abbey Chatters, DO, Caprice Kluver, Casimer Bilis, Technician Referring MD:              Medicines:                See the Anesthesia note for documentation of the                            administered medications Complications:            No immediate complications. Estimated Blood Loss:     Estimated blood loss was minimal. Procedure:                Pre-Anesthesia Assessment:                           - The anesthesia plan was to use monitored                            anesthesia care (MAC).                           After obtaining informed consent, the colonoscope                            was passed under direct vision. Throughout the                            procedure, the patient's blood pressure, pulse, and                            oxygen saturations were monitored continuously. The                            [id number]-HQ190L ([id number]) scope was introduced through                            the anus and advanced to the the cecum, identified                            by appendiceal orifice and ileocecal valve. The                            colonoscopy was performed without difficulty. The                            patient tolerated the procedure well. The  quality                            of the bowel preparation was evaluated using the                            BBPS [facility] Bowel Preparation Scale) with scores                            of: Right Colon = 2 (minor amount of residual                            staining, small fragments of stool and/or opaque                            liquid, but  mucosa seen well), Transverse Colon = 3                            (entire mucosa seen well with no residual staining,                            small fragments of stool or opaque liquid) and Left                            Colon = 3 (entire mucosa seen well with no residual                            staining, small fragments of stool or opaque                            liquid). The total BBPS score equals 8. The quality                            of the bowel preparation was good. Scope In: 9:34:58 AM Scope Out: 9:49:13 AM Scope Withdrawal Time: 0 hours 8 minutes 48 seconds  Total Procedure Duration: 0 hours 14 minutes 15 seconds  Findings:      The perianal and digital rectal examinations were normal.      Non-bleeding internal hemorrhoids were found during endoscopy.      Many small and large-mouthed diverticula were found in the sigmoid       colon, descending colon and transverse colon.      Three sessile polyps were found in the transverse colon. The polyps were       7 to 9 mm in size. These polyps were removed with a cold snare.       Resection and retrieval were complete. Impression:               - Non-bleeding internal hemorrhoids.                           - Diverticulosis in the sigmoid colon, in the                            descending colon and  in the transverse colon.                           - Three 7 to 9 mm polyps in the transverse colon,                            removed with a cold snare. Resected and retrieved. Moderate Sedation:      Per Anesthesia Care Recommendation:           - Patient has a contact number available for                            emergencies. The signs and symptoms of potential                            delayed complications were discussed with the                            patient. Return to normal activities tomorrow.                            Written discharge instructions were provided to the                            patient.                            - Resume previous diet.                           - Continue present medications.                           - Await pathology results.                           - Repeat colonoscopy in 5 years for surveillance.                           - Return to GI clinic PRN. Procedure Code(s):        --- Professional ---                           [phone], Colonoscopy, flexible; with removal of                            tumor(s), polyp(s), or other lesion(s) by snare                            technique Diagnosis Code(s):        --- Professional ---                           Z12.11, Encounter for screening for malignant  neoplasm of colon                           K64.8, Other hemorrhoids                           K63.5, Polyp of colon                           K57.30, Diverticulosis of large intestine without                            perforation or abscess without bleeding CPT copyright 2019 American Medical Association. All rights reserved. The codes documented in this report are preliminary and upon coder review may  be revised to meet current compliance requirements. Elon Alas. Abbey Chatters, DO [place] Abbey Chatters, DO 04/03/2020 9:51:59 AM This report has been signed electronically. Number of Addenda: 0

## 2020-04-03 NOTE — H&P (Signed)
Primary Care Physician:  Kathyrn Drown, MD Primary Gastroenterologist:  Dr. Abbey Chatters  Pre-Procedure History & Physical: HPI:  Derek Barrett is a 54 y.o. male is here for a colonoscopy for colon cancer screening purposes.  Patient denies any family history of colorectal cancer.  No melena or hematochezia.  No abdominal pain or unintentional weight loss.  No change in bowel habits.  Overall feels well from a GI standpoint.  Past Medical History:  Diagnosis Date  . Aneurysm ([place])   . B12 deficiency 05/04/2018   Patient's MCV was elevated, B12 low normal, instructed to do oral B12 supplementation, instructed to limit alcohol use  . Hypertension     Past Surgical History:  Procedure Laterality Date  . ADENOIDECTOMY    . WISDOM TOOTH EXTRACTION      Prior to Admission medications   Medication Sig Start Date End Date Taking? Authorizing Provider  albuterol (VENTOLIN HFA) 108 (90 Base) MCG/ACT inhaler Inhale 2 puffs into the lungs every 4 (four) hours as needed for wheezing. 02/12/20  Yes Luking, Elayne Snare, MD  amLODipine (NORVASC) 5 MG tablet Take 1 tablet (5 mg total) by mouth daily. 11/19/19  Yes Luking, Elayne Snare, MD  fexofenadine (ALLEGRA) 180 MG tablet Take 180 mg by mouth daily.   Yes [provider]  hydrochlorothiazide (HYDRODIURIL) 25 MG tablet Take 1 tablet (25 mg total) by mouth daily. 11/19/19  Yes Luking, Elayne Snare, MD  potassium chloride (KLOR-CON) 10 MEQ tablet Take 1 tablet (10 mEq total) by mouth daily. 11/19/19  Yes Luking, Elayne Snare, MD  vitamin B-12 (CYANOCOBALAMIN) 500 MCG tablet Take 500 mcg by mouth daily.   Yes [provider]    Allergies as of 03/11/2020 - Review Complete 03/03/2020  Allergen Reaction Noted  . Penicillins  05/18/2015    History reviewed. No pertinent family history.  Social History   Socioeconomic History  . Marital status: Married    Spouse name: Not on file  . Number of children: Not on file  . Years of education: Not on file   . Highest education level: Not on file  Occupational History  . Not on file  Tobacco Use  . Smoking status: Former Smoker    Packs/day: 1.00  . Smokeless tobacco: Never Used  . Tobacco comment: 1 pack a day since age 85  Vaping Use  . Vaping Use: Never used  Substance and Sexual Activity  . Alcohol use: Yes    Alcohol/week: 2.0 standard drinks    Types: 2 Cans of beer per week  . Drug use: Never  . Sexual activity: Not on file  Other Topics Concern  . Not on file  Social History Narrative  . Not on file   Social Determinants of Health   Financial Resource Strain:   . Difficulty of Paying Living Expenses: Not on file  Food Insecurity:   . Worried About Charity fundraiser in the Last Year: Not on file  . Ran Out of Food in the Last Year: Not on file  Transportation Needs:   . Lack of Transportation (Medical): Not on file  . Lack of Transportation (Non-Medical): Not on file  Physical Activity:   . Days of Exercise per Week: Not on file  . Minutes of Exercise per Session: Not on file  Stress:   . Feeling of Stress : Not on file  Social Connections:   . Frequency of Communication with Friends and Family: Not on file  . Frequency of  Social Gatherings with Friends and Family: Not on file  . Attends Religious Services: Not on file  . Active Member of Clubs or Organizations: Not on file  . Attends Archivist Meetings: Not on file  . Marital Status: Not on file  Intimate Partner Violence:   . Fear of Current or Ex-Partner: Not on file  . Emotionally Abused: Not on file  . Physically Abused: Not on file  . Sexually Abused: Not on file    Review of Systems: See HPI, otherwise negative ROS  Impression/Plan: Derek Barrett is here for a colonoscopy to be performed for colon cancer screening purposes.  The risks of the procedure including infection, bleed, or perforation as well as benefits, limitations, alternatives and imponderables have been reviewed with  the patient. Questions have been answered. All parties agreeable.

## 2020-04-03 NOTE — Anesthesia Postprocedure Evaluation (Signed)
Anesthesia Post Note  Patient: Derek Barrett  Procedure(s) Performed: COLONOSCOPY WITH PROPOFOL (N/A ) POLYPECTOMY  Patient location during evaluation: Endoscopy Anesthesia Type: General Level of consciousness: awake and alert Pain management: pain level controlled Vital Signs Assessment: post-procedure vital signs reviewed and stable Respiratory status: spontaneous breathing Cardiovascular status: blood pressure returned to baseline and stable Postop Assessment: no apparent nausea or vomiting Anesthetic complications: no   No complications documented.   Last Vitals:  Vitals:   04/03/20 0819  BP: 138/81  Pulse: 68  Resp: 10  Temp: 36.7 C  SpO2: 99%    Last Pain:  Vitals:   04/03/20 0930  TempSrc:   PainSc: 0-No pain                 Nelsy Madonna

## 2020-04-03 NOTE — Discharge Instructions (Addendum)
Colonoscopy Discharge Instructions  Read the instructions outlined below and refer to this sheet in the next few weeks. These discharge instructions provide you with general information on caring for yourself after you leave the hospital. Your doctor may also give you specific instructions. While your treatment has been planned according to the most current medical practices available, unavoidable complications occasionally occur.   ACTIVITY  You may resume your regular activity, but move at a slower pace for the next 24 hours.   Take frequent rest periods for the next 24 hours.   Walking will help get rid of the air and reduce the bloated feeling in your belly (abdomen).   No driving for 24 hours (because of the medicine (anesthesia) used during the test).    Do not sign any important legal documents or operate any machinery for 24 hours (because of the anesthesia used during the test).  NUTRITION  Drink plenty of fluids.   You may resume your normal diet as instructed by your doctor.   Begin with a light meal and progress to your normal diet. Heavy or fried foods are harder to digest and may make you feel sick to your stomach (nauseated).   Avoid alcoholic beverages for 24 hours or as instructed.  MEDICATIONS  You may resume your normal medications unless your doctor tells you otherwise.  WHAT YOU CAN EXPECT TODAY  Some feelings of bloating in the abdomen.   Passage of more gas than usual.   Spotting of blood in your stool or on the toilet paper.  IF YOU HAD POLYPS REMOVED DURING THE COLONOSCOPY:  No aspirin products for 7 days or as instructed.   No alcohol for 7 days or as instructed.   Eat a soft diet for the next 24 hours.  FINDING OUT THE RESULTS OF YOUR TEST Not all test results are available during your visit. If your test results are not back during the visit, make an appointment with your caregiver to find out the results. Do not assume everything is normal if  you have not heard from your caregiver or the medical facility. It is important for you to follow up on all of your test results.  SEEK IMMEDIATE MEDICAL ATTENTION IF:  You have more than a spotting of blood in your stool.   Your belly is swollen (abdominal distention).   You are nauseated or vomiting.   You have a temperature over 101.   You have abdominal pain or discomfort that is severe or gets worse throughout the day.   Your colonoscopy revealed 3 polyp(s) which I removed successfully. Await pathology results, my office will contact you. I recommend repeating colonoscopy in 5 years for surveillance purposes.   You also have diverticulosis and internal hemorrhoids. I would recommend increasing fiber in your diet or adding OTC Benefiber/Metamucil. Be sure to drink at least 4 to 6 glasses of water daily. Follow-up with GI as needed.   I hope you have a great rest of your week!    Elon Alas. Abbey Chatters, D.O. Gastroenterology and Hepatology [facility]   Colon Polyps  Polyps are tissue growths inside the body. Polyps can grow in many places, including the large intestine (colon). A polyp may be a round bump or a mushroom-shaped growth. You could have one polyp or several. Most colon polyps are noncancerous (benign). However, some colon polyps can become cancerous over time. Finding and removing the polyps early can help prevent this. What are the causes? The exact  cause of colon polyps is not known. What increases the risk? You are more likely to develop this condition if you:  Have a family history of colon cancer or colon polyps.  Are older than 8 or older than 45 if you are African American.  Have inflammatory bowel disease, such as ulcerative colitis or Crohn's disease.  Have certain hereditary conditions, such as: ? Familial adenomatous polyposis. ? Lynch syndrome. ? Turcot syndrome. ? Peutz-Jeghers syndrome.  Are overweight.  Smoke  cigarettes.  Do not get enough exercise.  Drink too much alcohol.  Eat a diet that is high in fat and red meat and low in fiber.  Had childhood cancer that was treated with abdominal radiation. What are the signs or symptoms? Most polyps do not cause symptoms. If you have symptoms, they may include:  Blood coming from your rectum when having a bowel movement.  Blood in your stool. The stool may look dark red or black.  Abdominal pain.  A change in bowel habits, such as constipation or diarrhea. How is this diagnosed? This condition is diagnosed with a colonoscopy. This is a procedure in which a lighted, flexible scope is inserted into the anus and then passed into the colon to examine the area. Polyps are sometimes found when a colonoscopy is done as part of routine cancer screening tests. How is this treated? Treatment for this condition involves removing any polyps that are found. Most polyps can be removed during a colonoscopy. Those polyps will then be tested for cancer. Additional treatment may be needed depending on the results of testing. Follow these instructions at home: Lifestyle  Maintain a healthy weight, or lose weight if recommended by your health care provider.  Exercise every day or as told by your health care provider.  Do not use any products that contain nicotine or tobacco, such as cigarettes and e-cigarettes. If you need help quitting, ask your health care provider.  If you drink alcohol, limit how much you have: ? 0-1 drink a day for women. ? 0-2 drinks a day for men.  Be aware of how much alcohol is in your drink. In the [place] one drink equals one 12 oz bottle of beer (355 mL), one 5 oz glass of wine (148 mL), or one 1 oz shot of hard liquor (44 mL). Eating and drinking   Eat foods that are high in fiber, such as fruits, vegetables, and whole grains.  Eat foods that are high in calcium and vitamin D, such as milk, cheese, yogurt, eggs, liver, fish,  and broccoli.  Limit foods that are high in fat, such as fried foods and desserts.  Limit the amount of red meat and processed meat you eat, such as hot dogs, sausage, bacon, and lunch meats. General instructions  Keep all follow-up visits as told by your health care provider. This is important. ? This includes having regularly scheduled colonoscopies. ? Talk to your health care provider about when you need a colonoscopy. Contact a health care provider if:  You have new or worsening bleeding during a bowel movement.  You have new or increased blood in your stool.  You have a change in bowel habits.  You lose weight for no known reason. Summary  Polyps are tissue growths inside the body. Polyps can grow in many places, including the colon.  Most colon polyps are noncancerous (benign), but some can become cancerous over time.  This condition is diagnosed with a colonoscopy.  Treatment for this condition  involves removing any polyps that are found. Most polyps can be removed during a colonoscopy. This information is not intended to replace advice given to you by your health care provider. Make sure you discuss any questions you have with your health care provider. Document Revised: 09/07/2017 Document Reviewed: 09/07/2017 Elsevier Patient Education  [employer].

## 2020-04-03 NOTE — Anesthesia Preprocedure Evaluation (Signed)
Anesthesia Evaluation  Patient identified by MRN, date of birth, ID band Patient awake    Reviewed: Allergy & Precautions, NPO status , Patient's Chart, lab work & pertinent test results, reviewed documented beta blocker date and time   History of Anesthesia Complications Negative for: history of anesthetic complications  Airway Mallampati: II  TM Distance: >3 FB Neck ROM: Full    Dental no notable dental hx.    Pulmonary former smoker,    Pulmonary exam normal breath sounds clear to auscultation       Cardiovascular hypertension, Normal cardiovascular exam Rhythm:Regular Rate:Normal     Neuro/Psych negative neurological ROS     GI/Hepatic negative GI ROS, Neg liver ROS,   Endo/Other  negative endocrine ROS  Renal/GU negative Renal ROS     Musculoskeletal   Abdominal   Peds  Hematology   Anesthesia Other Findings   Reproductive/Obstetrics                             Anesthesia Physical Anesthesia Plan  ASA: II  Anesthesia Plan: General   Post-op Pain Management:    Induction: Intravenous  PONV Risk Score and Plan:   Airway Management Planned: Nasal Cannula  Additional Equipment:   Intra-op Plan:   Post-operative Plan:   Informed Consent: I have reviewed the patients History and Physical, chart, labs and discussed the procedure including the risks, benefits and alternatives for the proposed anesthesia with the patient or authorized representative who has indicated his/her understanding and acceptance.     Dental advisory given  Plan Discussed with: CRNA  Anesthesia Plan Comments:         Anesthesia Quick Evaluation

## 2020-04-03 NOTE — Transfer of Care (Signed)
Immediate Anesthesia Transfer of Care Note  Patient: Derek Barrett  Procedure(s) Performed: COLONOSCOPY WITH PROPOFOL (N/A ) POLYPECTOMY  Patient Location: PACU  Anesthesia Type:General  Level of Consciousness: awake  Airway & Oxygen Therapy: Patient Spontanous Breathing  Post-op Assessment: Report given to RN  Post vital signs: Reviewed  Last Vitals:  Vitals Value Taken Time  BP    Temp    Pulse    Resp    SpO2      Last Pain:  Vitals:   04/03/20 0930  TempSrc:   PainSc: 0-No pain      Patients Stated Pain Goal: 8 (54/88/45 7334)  Complications: No complications documented.

## 2020-04-06 LAB — SURGICAL PATHOLOGY

## 2020-04-08 ENCOUNTER — Encounter (HOSPITAL_COMMUNITY): Payer: Self-pay | Admitting: Internal Medicine

## 2020-05-06 ENCOUNTER — Other Ambulatory Visit: Payer: Self-pay | Admitting: Family Medicine

## 2020-07-28 ENCOUNTER — Other Ambulatory Visit: Payer: Self-pay | Admitting: Family Medicine

## 2020-07-28 DIAGNOSIS — I10 Essential (primary) hypertension: Secondary | ICD-10-CM

## 2020-07-28 DIAGNOSIS — E782 Mixed hyperlipidemia: Secondary | ICD-10-CM

## 2020-07-28 DIAGNOSIS — Z125 Encounter for screening for malignant neoplasm of prostate: Secondary | ICD-10-CM

## 2020-07-28 NOTE — Telephone Encounter (Signed)
1.  May have 2 refills 2.  Highly recommend wellness checkup this spring please schedule, do lipid, CMP, PSA before visit thank you

## 2020-07-28 NOTE — Telephone Encounter (Signed)
Last seen 02/04/20 for copd

## 2020-11-04 ENCOUNTER — Other Ambulatory Visit: Payer: Self-pay | Admitting: Family Medicine

## 2020-12-15 ENCOUNTER — Other Ambulatory Visit: Payer: Self-pay | Admitting: Family Medicine

## 2020-12-15 NOTE — Telephone Encounter (Signed)
Please contact patient to have him schedule follow up; then may route back to nurses. Thank you!

## 2020-12-15 NOTE — Telephone Encounter (Signed)
Needs to schedule appointment either for later July or 4 August then may have refill

## 2020-12-17 ENCOUNTER — Telehealth: Payer: BC Managed Care – PPO | Admitting: Family Medicine

## 2020-12-17 NOTE — Telephone Encounter (Signed)
Patient is completely out of medications and needing refills all medications. Last appointment was 6/15 by phone. Explain will need appointment to get refills but would send back message for hydrochlorothiazide 25 mg,potassium chloride 10 mg, amlodipine 5 mg Derek Barrett  just enough until he can schedule appointment.

## 2020-12-17 NOTE — Telephone Encounter (Signed)
Pt has to schedule appt before any refills per provider. Please have pt schedule appt. Thank you!

## 2020-12-17 NOTE — Telephone Encounter (Signed)
Left message to schedule medication follow up per provider before medication can be called in

## 2020-12-21 ENCOUNTER — Telehealth: Payer: Self-pay | Admitting: Family Medicine

## 2020-12-21 DIAGNOSIS — Z114 Encounter for screening for human immunodeficiency virus [HIV]: Secondary | ICD-10-CM

## 2020-12-21 DIAGNOSIS — Z125 Encounter for screening for malignant neoplasm of prostate: Secondary | ICD-10-CM

## 2020-12-21 DIAGNOSIS — Z79899 Other long term (current) drug therapy: Secondary | ICD-10-CM

## 2020-12-21 DIAGNOSIS — E538 Deficiency of other specified B group vitamins: Secondary | ICD-10-CM

## 2020-12-21 DIAGNOSIS — I10 Essential (primary) hypertension: Secondary | ICD-10-CM

## 2020-12-21 DIAGNOSIS — E782 Mixed hyperlipidemia: Secondary | ICD-10-CM

## 2020-12-21 DIAGNOSIS — Z1159 Encounter for screening for other viral diseases: Secondary | ICD-10-CM

## 2020-12-21 NOTE — Telephone Encounter (Signed)
Called twice to schedule appointment and patient has not called back. But he called in for refills and was told again need to schedule appointment before medications can be filled.

## 2020-12-21 NOTE — Telephone Encounter (Signed)
Lipid, liver, metabolic 7, PSA, vitamin T02, HIV antibody, hep C antibody  B12 deficiency, hyperlipidemia, screening labs, screening labs per [facility] guideline

## 2020-12-21 NOTE — Telephone Encounter (Signed)
Pt has an upcoming physical on 01/19/2021 and is requesting blood work. Pt would like to a call back if this is possible to let him know that the orders are in

## 2020-12-21 NOTE — Telephone Encounter (Signed)
Last labs 09/11/19:  Lipid, Liver, Met 7 and PSA

## 2020-12-22 NOTE — Telephone Encounter (Signed)
Lab orders placed and pt is aware 

## 2021-01-12 DIAGNOSIS — Z79899 Other long term (current) drug therapy: Secondary | ICD-10-CM | POA: Diagnosis not present

## 2021-01-12 DIAGNOSIS — Z125 Encounter for screening for malignant neoplasm of prostate: Secondary | ICD-10-CM | POA: Diagnosis not present

## 2021-01-12 DIAGNOSIS — E538 Deficiency of other specified B group vitamins: Secondary | ICD-10-CM | POA: Diagnosis not present

## 2021-01-12 DIAGNOSIS — Z114 Encounter for screening for human immunodeficiency virus [HIV]: Secondary | ICD-10-CM | POA: Diagnosis not present

## 2021-01-12 DIAGNOSIS — Z1159 Encounter for screening for other viral diseases: Secondary | ICD-10-CM | POA: Diagnosis not present

## 2021-01-12 DIAGNOSIS — I10 Essential (primary) hypertension: Secondary | ICD-10-CM | POA: Diagnosis not present

## 2021-01-12 DIAGNOSIS — E782 Mixed hyperlipidemia: Secondary | ICD-10-CM | POA: Diagnosis not present

## 2021-01-13 LAB — LIPID PANEL
Chol/HDL Ratio: 2.2 ratio (ref 0.0–5.0)
Cholesterol, Total: 219 mg/dL — ABNORMAL HIGH (ref 100–199)
HDL: 99 mg/dL (ref >39)
LDL Chol Calc (NIH): 109 mg/dL — ABNORMAL HIGH (ref 0–99)
Triglycerides: 60 mg/dL (ref 0–149)
VLDL Cholesterol Cal: 11 mg/dL (ref 5–40)

## 2021-01-13 LAB — PSA: Prostate Specific Ag, Serum: 0.4 ng/mL (ref 0.0–4.0)

## 2021-01-13 LAB — HEPATIC FUNCTION PANEL
ALT: 13 IU/L (ref 0–44)
AST: 19 IU/L (ref 0–40)
Albumin: 4.8 g/dL (ref 3.8–4.9)
Alkaline Phosphatase: 88 IU/L (ref 44–121)
Bilirubin Total: 0.4 mg/dL (ref 0.0–1.2)
Bilirubin, Direct: 0.13 mg/dL (ref 0.00–0.40)
Total Protein: 7.1 g/dL (ref 6.0–8.5)

## 2021-01-13 LAB — BASIC METABOLIC PANEL
BUN/Creatinine Ratio: 14 (ref 9–20)
BUN: 14 mg/dL (ref 6–24)
CO2: 24 mmol/L (ref 20–29)
Calcium: 9.6 mg/dL (ref 8.7–10.2)
Chloride: 101 mmol/L (ref 96–106)
Creatinine, Ser: 1.03 mg/dL (ref 0.76–1.27)
Glucose: 100 mg/dL — ABNORMAL HIGH (ref 65–99)
Potassium: 4.5 mmol/L (ref 3.5–5.2)
Sodium: 142 mmol/L (ref 134–144)
eGFR: 86 mL/min/{1.73_m2} (ref >59)

## 2021-01-13 LAB — VITAMIN B12: Vitamin B-12: 418 pg/mL (ref 232–1245)

## 2021-01-13 LAB — HIV ANTIBODY (ROUTINE TESTING W REFLEX): HIV Screen 4th Generation wRfx: NONREACTIVE

## 2021-01-13 LAB — HEPATITIS C ANTIBODY: Hep C Virus Ab: <0.1 s/co ratio (ref 0.0–0.9)

## 2021-01-14 ENCOUNTER — Other Ambulatory Visit: Payer: Self-pay | Admitting: Family Medicine

## 2021-01-19 ENCOUNTER — Encounter: Payer: BC Managed Care – PPO | Admitting: Family Medicine

## 2021-01-25 ENCOUNTER — Other Ambulatory Visit: Payer: Self-pay

## 2021-01-25 ENCOUNTER — Ambulatory Visit: Payer: BC Managed Care – PPO | Admitting: Family Medicine

## 2021-01-25 ENCOUNTER — Encounter: Payer: Self-pay | Admitting: Family Medicine

## 2021-01-25 VITALS — BP 134/74 | HR 72 | Temp 97.0°F | Ht 72.0 in | Wt 221.0 lb

## 2021-01-25 DIAGNOSIS — I10 Essential (primary) hypertension: Secondary | ICD-10-CM | POA: Diagnosis not present

## 2021-01-25 DIAGNOSIS — J4520 Mild intermittent asthma, uncomplicated: Secondary | ICD-10-CM

## 2021-01-25 DIAGNOSIS — E782 Mixed hyperlipidemia: Secondary | ICD-10-CM | POA: Diagnosis not present

## 2021-01-25 MED ORDER — AMLODIPINE BESYLATE 5 MG PO TABS
5.0000 mg | ORAL_TABLET | Freq: Every day | ORAL | 1 refills | Status: DC
Start: 2021-01-25 — End: 2021-05-03

## 2021-01-25 MED ORDER — ALBUTEROL SULFATE HFA 108 (90 BASE) MCG/ACT IN AERS
INHALATION_SPRAY | RESPIRATORY_TRACT | 2 refills | Status: DC
Start: 2021-01-25 — End: 2021-03-03

## 2021-01-25 MED ORDER — POTASSIUM CHLORIDE CRYS ER 10 MEQ PO TBCR: 10.0000 meq | EXTENDED_RELEASE_TABLET | Freq: Every day | ORAL | 1 refills | Status: DC

## 2021-01-25 MED ORDER — HYDROCHLOROTHIAZIDE 25 MG PO TABS
25.0000 mg | ORAL_TABLET | Freq: Every day | ORAL | 1 refills | Status: DC
Start: 2021-01-25 — End: 2021-08-16

## 2021-01-25 NOTE — Progress Notes (Signed)
   Subjective:    Patient ID: Derek Barrett, male    DOB: 05-23-1966, 55 y.o.   MRN: [id number]  Hypertension Treatments tried: amlodipine, hydrochlorothiazide.  Multiple blood pressure readings have been mildly elevated but he denies any headaches chest pain shortness of breath Ran out of his medications 4 days ago Recently did lab work Patient does have some restrictive lung issues early in the morning when he wakes up albuterol typically helps him  Review of Systems     Objective:   Physical Exam  General-in no acute distress Eyes-no discharge Lungs-respiratory rate normal, CTA CV-no murmurs,RRR Extremities skin warm dry no edema Neuro grossly normal Behavior normal, alert       Assessment & Plan:  1. Essential hypertension Blood pressure actually under decent control He will bring his blood pressure cuff from home and we will help see if it calibrates with ours Continue medication refill sent in Lab work reviewed Wellness exam in September  2. Mixed hyperlipidemia LDL slightly elevated but HDL is fantastic no need for medication healthy diet recommended  3. Mild intermittent reactive airway disease without complication He gets restrictive lung early in the morning when he takes a deep breath and uses albuterol and it helps Has a long history of smoking just recently quit Patient will benefit from seeing pulmonary for further evaluation for possible COPD  I also discussed low-dose CT scan of the chest he is interested in hearing more about this and then he will decide whether or not to go forward with it

## 2021-02-15 ENCOUNTER — Encounter: Payer: Self-pay | Admitting: Family Medicine

## 2021-02-15 ENCOUNTER — Institutional Professional Consult (permissible substitution): Payer: BC Managed Care – PPO | Admitting: Pulmonary Disease

## 2021-02-15 ENCOUNTER — Other Ambulatory Visit: Payer: Self-pay

## 2021-02-15 ENCOUNTER — Ambulatory Visit (INDEPENDENT_AMBULATORY_CARE_PROVIDER_SITE_OTHER): Payer: BC Managed Care – PPO | Admitting: Family Medicine

## 2021-02-15 VITALS — BP 132/80 | HR 77 | Temp 97.7°F | Ht 72.0 in | Wt 220.0 lb

## 2021-02-15 DIAGNOSIS — L989 Disorder of the skin and subcutaneous tissue, unspecified: Secondary | ICD-10-CM | POA: Diagnosis not present

## 2021-02-15 DIAGNOSIS — I712 Thoracic aortic aneurysm, without rupture, unspecified: Secondary | ICD-10-CM

## 2021-02-15 DIAGNOSIS — Z23 Encounter for immunization: Secondary | ICD-10-CM

## 2021-02-15 DIAGNOSIS — Z0001 Encounter for general adult medical examination with abnormal findings: Secondary | ICD-10-CM | POA: Diagnosis not present

## 2021-02-15 DIAGNOSIS — Z0000 Encounter for general adult medical examination without abnormal findings: Secondary | ICD-10-CM

## 2021-02-15 NOTE — Patient Instructions (Signed)

## 2021-02-15 NOTE — Progress Notes (Signed)
   Subjective:    Patient ID: Derek Barrett, male    DOB: 07-01-1965, 55 y.o.   MRN: [id number]  HPI Annual Exam   The patient comes in today for a wellness visit.    A review of their health history was completed.  A review of medications was also completed.  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   Additional concerns: none Encounter for immunization - Plan: Flu Vaccine QUAD 57moIM (Fluarix, Fluzone & Alfiuria Quad PF)  Well adult exam  Thoracic aortic aneurysm without rupture ([place] - Plan: CT ANGIO CHEST AORTA W/CM & OR WO/CM  Skin lesion - Plan: Ambulatory referral to Dermatology Please see discussion below he needs a CT follow-up  Review of Systems     Objective:   Physical Exam General-in no acute distress Eyes-no discharge Lungs-respiratory rate normal, CTA CV-no murmurs,RRR Extremities skin warm dry no edema Neuro grossly normal Behavior normal, alert Patient has a skin lesion on his left forearm does not appear cancerous but it does have some hyperkeratotic areas I recommend referral to dermatology  Patient brings in his cuff we checked it with his cuff and with our cuff ours is considerably lower.     Assessment & Plan:  1. Encounter for immunization Flu shot today COVID-vaccine recommended - Flu Vaccine QUAD 652moM (Fluarix, Fluzone & Alfiuria Quad PF)  2. Well adult exam Adult wellness-complete.wellness physical was conducted today. Importance of diet and exercise were discussed in detail.  In addition to this a discussion regarding safety was also covered. We also reviewed over immunizations and gave recommendations regarding current immunization  needed for age.  In addition to this additional areas were also touched on including: Preventative health exams needed:  Colonoscopy Next 1 is 2026  Patient was advised yearly wellness exam   3. [facility].  Awaiting the findings and this may need to go back to see vascular.  Vascular previously recommended a follow-up scan in 2 years.  Patient states he was never contacted.  Skin lesion recommend referral to dermatology

## 2021-03-02 ENCOUNTER — Other Ambulatory Visit: Payer: Self-pay | Admitting: Family Medicine

## 2021-03-09 DIAGNOSIS — L821 Other seborrheic keratosis: Secondary | ICD-10-CM | POA: Diagnosis not present

## 2021-03-10 ENCOUNTER — Encounter: Payer: Self-pay | Admitting: Pulmonary Disease

## 2021-03-10 ENCOUNTER — Other Ambulatory Visit: Payer: Self-pay

## 2021-03-10 ENCOUNTER — Ambulatory Visit (INDEPENDENT_AMBULATORY_CARE_PROVIDER_SITE_OTHER): Payer: BC Managed Care – PPO

## 2021-03-10 ENCOUNTER — Ambulatory Visit: Payer: BC Managed Care – PPO | Admitting: Pulmonary Disease

## 2021-03-10 VITALS — BP 130/88 | HR 74 | Temp 98.1°F | Ht 72.0 in | Wt 221.2 lb

## 2021-03-10 DIAGNOSIS — R0602 Shortness of breath: Secondary | ICD-10-CM

## 2021-03-10 MED ORDER — TRELEGY ELLIPTA 100-62.5-25 MCG/INH IN AEPB: 1.0000 | INHALATION_SPRAY | Freq: Every day | RESPIRATORY_TRACT | 3 refills | Status: DC

## 2021-03-10 NOTE — Patient Instructions (Addendum)
Shortness of breath Possible obstructive lung disease  Chest x-ray today Pulmonary function test on day of next visit  Low-dose CT screening-over 30-pack-year smoking history  Call with significant concerns  Follow-up in 2 to 3 months  Prescription for Trelegy Continue albuterol

## 2021-03-10 NOTE — Progress Notes (Signed)
Derek Barrett    [id number]    01/26/1966  Primary Care Physician:Luking, Elayne Snare, MD  Referring Physician: Kathyrn Drown, MD [place],  [place] [address]  Chief complaint:   Shortness of breath on exertion  HPI:  Reformed smoker Shortness of breath on exertion  2 to 3 times a day use of albuterol  Will wake up in the morning with a cough and some tightness, some wheezing  Quit smoking about 13 months ago Over 30-pack-year smoking history  History of hypertension, allergies  Worked in Event organiser  Outpatient Encounter Medications as of 03/10/2021  Medication Sig   albuterol (VENTOLIN HFA) 108 (90 Base) MCG/ACT inhaler INHALE 2 PUFFS INTO THE LUNGS EVERY 4 HOURS AS NEEDED FOR WHEEZING.   amLODipine (NORVASC) 5 MG tablet Take 1 tablet (5 mg total) by mouth daily.   fexofenadine (ALLEGRA) 180 MG tablet Take 180 mg by mouth daily.   hydrochlorothiazide (HYDRODIURIL) 25 MG tablet Take 1 tablet (25 mg total) by mouth daily.   potassium chloride (KLOR-CON) 10 MEQ tablet Take 1 tablet (10 mEq total) by mouth daily.   vitamin B-12 (CYANOCOBALAMIN) 500 MCG tablet Take 500 mcg by mouth daily.   [DISCONTINUED] potassium chloride (KLOR-CON) 10 MEQ tablet Take 1 tablet (10 mEq total) by mouth daily.   No facility-administered encounter medications on file as of 03/10/2021.    Allergies as of 03/10/2021 - Review Complete 03/10/2021  Allergen Reaction Noted   Penicillins Other (See Comments) 05/18/2015    Past Medical History:  Diagnosis Date   Aneurysm ([place])    B12 deficiency 05/04/2018   Patient's MCV was elevated, B12 low normal, instructed to do oral B12 supplementation, instructed to limit alcohol use   Hypertension     Past Surgical History:  Procedure Laterality Date   ADENOIDECTOMY     COLONOSCOPY WITH PROPOFOL N/A 04/03/2020   Procedure: COLONOSCOPY WITH PROPOFOL;  Surgeon: Eloise Harman, DO;  [facility];   Service: Endoscopy;  Laterality: N/A;  10:00   POLYPECTOMY  04/03/2020   Procedure: POLYPECTOMY;  Surgeon: Eloise Harman, DO;  [facility];  Service: Endoscopy;;   WISDOM TOOTH EXTRACTION      History reviewed. No pertinent family history.  Social History   Socioeconomic History   Marital status: Married    Spouse name: Not on file   Number of children: Not on file   Years of education: Not on file   Highest education level: Not on file  Occupational History   Not on file  Tobacco Use   Smoking status: Former    Packs/day: 1.00    Types: Cigarettes   Smokeless tobacco: Never   Tobacco comments:    1 pack a day since age 28  Vaping Use   Vaping Use: Never used  Substance and Sexual Activity   Alcohol use: Yes    Alcohol/week: 2.0 standard drinks    Types: 2 Cans of beer per week   Drug use: Never   Sexual activity: Not on file  Other Topics Concern   Not on file  Social History Narrative   Not on file   Social Determinants of Health   Financial Resource Strain: Not on file  Food Insecurity: Not on file  Transportation Needs: Not on file  Physical Activity: Not on file  Stress: Not on file  Social Connections: Not on file  Intimate Partner Violence: Not  on file    Review of Systems  Respiratory:  Positive for cough and shortness of breath.    There were no vitals filed for this visit.   Physical Exam Constitutional:      Appearance: Normal appearance.  HENT:     Head: Normocephalic.     Mouth/Throat:     Mouth: Mucous membranes are moist.  Cardiovascular:     Rate and Rhythm: Normal rate and regular rhythm.     Heart sounds: No murmur heard.   No friction rub.  Pulmonary:     Effort: No respiratory distress.     Breath sounds: No stridor. No wheezing or rhonchi.  Musculoskeletal:     Cervical back: No rigidity or tenderness.  Neurological:     Mental Status: He is alert.  Psychiatric:        Mood and Affect: Mood normal.      Data Reviewed: Last CT was 05/10/2017-within normal limits, aortic aneurysm was the concern but was not confirmed on that CT  Assessment:  History of significant smoking  Shortness of breath on exertion  Significant requirement for bronchodilators  Reformed smoker  History of aortic aneurysm  Plan/Recommendations: Obtain low-dose CT scan of the chest for lung cancer screening  Obtain pulmonary function test  Prescription for Trelegy  Continue using albuterol as needed  Graded exercise as tolerated  Obtain chest x-ray today  I will see him back in about 3 to 4 months  I spent 45 minutes dedicated to the care of this patient on the date of this encounter to include previsit review of records, face-to-face time with the patient discussing conditions above, post visit ordering of testing, clinical documentation with electronic health record  and communicated necessary findings to members of the patient's care team     Sherrilyn Rist MD [facility] 03/10/2021, 2:53 PM  CC: Kathyrn Drown, MD

## 2021-03-16 ENCOUNTER — Encounter (HOSPITAL_COMMUNITY): Payer: Self-pay | Admitting: Radiology

## 2021-03-16 ENCOUNTER — Other Ambulatory Visit: Payer: Self-pay

## 2021-03-16 ENCOUNTER — Ambulatory Visit (HOSPITAL_COMMUNITY)
Admission: RE | Admit: 2021-03-16 | Discharge: 2021-03-16 | Disposition: A | Discharge status: Home / Self Care | Payer: BC Managed Care – PPO | Source: Ambulatory Visit | Attending: Family Medicine | Admitting: Family Medicine

## 2021-03-16 DIAGNOSIS — I712 Thoracic aortic aneurysm, without rupture, unspecified: Secondary | ICD-10-CM | POA: Insufficient documentation

## 2021-03-16 DIAGNOSIS — K449 Diaphragmatic hernia without obstruction or gangrene: Secondary | ICD-10-CM | POA: Diagnosis not present

## 2021-03-16 MED ORDER — IOHEXOL 350 MG/ML SOLN
100.0000 mL | Freq: Once | INTRAVENOUS | Status: AC | PRN
  Administered 2021-03-16: 100 mL via INTRAVENOUS

## 2021-03-17 ENCOUNTER — Encounter (HOSPITAL_COMMUNITY): Payer: BC Managed Care – PPO

## 2021-03-18 ENCOUNTER — Ambulatory Visit (HOSPITAL_COMMUNITY)
Admission: RE | Admit: 2021-03-18 | Discharge: 2021-03-18 | Disposition: A | Discharge status: Home / Self Care | Payer: BC Managed Care – PPO | Source: Ambulatory Visit | Attending: Pulmonary Disease | Admitting: Pulmonary Disease

## 2021-03-18 ENCOUNTER — Other Ambulatory Visit: Payer: Self-pay

## 2021-03-18 DIAGNOSIS — R0602 Shortness of breath: Secondary | ICD-10-CM | POA: Diagnosis not present

## 2021-03-18 LAB — BLOOD GAS, ARTERIAL
Acid-Base Excess: 1.5 mmol/L (ref 0.0–2.0)
Bicarbonate: 25.2 mmol/L (ref 20.0–28.0)
Drawn by: 211791
FIO2: 21
O2 Saturation: 97.4 %
Patient temperature: 37
pCO2 arterial: 37.5 mmHg (ref 32.0–48.0)
pH, Arterial: 7.442 (ref 7.350–7.450)
pO2, Arterial: 85.5 mmHg (ref 83.0–108.0)

## 2021-03-18 NOTE — Progress Notes (Signed)
Patient in today for RA ABG.  Drawn and results in computer.  Oxygen saturation on pulse oximeter today was 97%.

## 2021-03-23 LAB — POCT I-STAT CREATININE: Creatinine, Ser: 1.1 mg/dL (ref 0.61–1.24)

## 2021-05-03 ENCOUNTER — Other Ambulatory Visit: Payer: Self-pay | Admitting: Family Medicine

## 2021-06-04 ENCOUNTER — Other Ambulatory Visit: Payer: Self-pay | Admitting: Family Medicine

## 2021-06-10 ENCOUNTER — Other Ambulatory Visit: Payer: Self-pay

## 2021-06-10 ENCOUNTER — Ambulatory Visit: Payer: BC Managed Care – PPO | Admitting: Pulmonary Disease

## 2021-06-10 ENCOUNTER — Encounter: Payer: Self-pay | Admitting: Pulmonary Disease

## 2021-06-10 VITALS — BP 124/80 | HR 61 | Temp 98.1°F | Ht 72.0 in | Wt 226.0 lb

## 2021-06-10 DIAGNOSIS — J449 Chronic obstructive pulmonary disease, unspecified: Secondary | ICD-10-CM | POA: Diagnosis not present

## 2021-06-10 MED ORDER — FLUTICASONE-UMECLIDIN-VILANT 100-62.5-25 MCG/ACT IN AEPB: 1.0000 | INHALATION_SPRAY | Freq: Every day | RESPIRATORY_TRACT | 3 refills | Status: DC

## 2021-06-10 NOTE — Progress Notes (Signed)
Derek Barrett    [id number]    [birth year]  Primary Care Physician:Luking, Elayne Snare, MD  Referring Physician: Kathyrn Drown, MD [place] [place],  [place] [address]  Chief complaint:   Shortness of breath on exertion  HPI:  Reformed smoker Shortness of breath on exertion  Breathing is a lot better with Trelegy, compliant with use  Decreased use of albuterol  symptoms are overall better  Quit smoking about 13 months ago Over 30-pack-year smoking history  History of hypertension, allergies  Worked in Event organiser  Outpatient Encounter Medications as of 06/10/2021  Medication Sig   albuterol (VENTOLIN HFA) 108 (90 Base) MCG/ACT inhaler INHALE 2 PUFFS INTO THE LUNGS EVERY 4 HOURS AS NEEDED FOR WHEEZING.   amLODipine (NORVASC) 5 MG tablet TAKE 1 TABLET BY MOUTH ONCE DAILY.   fexofenadine (ALLEGRA) 180 MG tablet Take 180 mg by mouth daily.   Fluticasone-Umeclidin-Vilant (TRELEGY ELLIPTA) 100-62.5-25 MCG/INH AEPB Inhale 1 puff into the lungs daily.   hydrochlorothiazide (HYDRODIURIL) 25 MG tablet Take 1 tablet (25 mg total) by mouth daily.   Loratadine-Pseudoephedrine (CLARITIN-D 24 HOUR PO) Take by mouth.   potassium chloride (KLOR-CON) 10 MEQ tablet Take 1 tablet (10 mEq total) by mouth daily.   vitamin B-12 (CYANOCOBALAMIN) 500 MCG tablet Take 500 mcg by mouth daily.   [DISCONTINUED] potassium chloride (KLOR-CON) 10 MEQ tablet Take 1 tablet (10 mEq total) by mouth daily.   No facility-administered encounter medications on file as of 06/10/2021.    Allergies as of 06/10/2021 - Review Complete 06/10/2021  Allergen Reaction Noted   Penicillins Other (See Comments) 05/18/2015    Past Medical History:  Diagnosis Date   Aneurysm ([place])    B12 deficiency 05/04/2018   Patient's MCV was elevated, B12 low normal, instructed to do oral B12 supplementation, instructed to limit alcohol use   Hypertension     Past Surgical History:  Procedure  Laterality Date   ADENOIDECTOMY     COLONOSCOPY WITH PROPOFOL N/A 04/03/2020   Procedure: COLONOSCOPY WITH PROPOFOL;  Surgeon: Eloise Harman, DO;  [facility];  Service: Endoscopy;  Laterality: N/A;  10:00   POLYPECTOMY  04/03/2020   Procedure: POLYPECTOMY;  Surgeon: Eloise Harman, DO;  [facility];  Service: Endoscopy;;   WISDOM TOOTH EXTRACTION      No family history on file.  Social History   Socioeconomic History   Marital status: Married    Spouse name: Not on file   Number of children: Not on file   Years of education: Not on file   Highest education level: Not on file  Occupational History   Not on file  Tobacco Use   Smoking status: Former    Packs/day: 1.00    Types: Cigarettes   Smokeless tobacco: Never   Tobacco comments:    1 pack a day since age 22  Vaping Use   Vaping Use: Never used  Substance and Sexual Activity   Alcohol use: Yes    Alcohol/week: 2.0 standard drinks    Types: 2 Cans of beer per week   Drug use: Never   Sexual activity: Not on file  Other Topics Concern   Not on file  Social History Narrative   Not on file   Social Determinants of Health   Financial Resource Strain: Not on file  Food Insecurity: Not on file  Transportation Needs: Not on file  Physical Activity: Not  on file  Stress: Not on file  Social Connections: Not on file  Intimate Partner Violence: Not on file    Review of Systems  Respiratory:  Negative for cough and shortness of breath.    Vitals:   06/10/21 0955  BP: 124/80  Pulse: 61  Temp: 98.1 F (36.7 C)  SpO2: 98%     Physical Exam Constitutional:      Appearance: Normal appearance.  HENT:     Head: Normocephalic.     Mouth/Throat:     Mouth: Mucous membranes are moist.  Cardiovascular:     Rate and Rhythm: Normal rate and regular rhythm.     Heart sounds: No murmur heard.   No friction rub.  Pulmonary:     Effort: No respiratory distress.     Breath sounds: No  stridor. No wheezing or rhonchi.  Musculoskeletal:     Cervical back: No rigidity or tenderness.  Neurological:     Mental Status: He is alert.  Psychiatric:        Mood and Affect: Mood normal.     Data Reviewed: Last CT was 05/10/2017-within normal limits, aortic aneurysm was the concern but was not confirmed on that CT Cardiac CT recently 03/16/2021-some emphysema  Assessment:  History of significant smoking   shortness of breath is better on Trelegy  Shortness of breath on exertion  Significant requirement for bronchodilators  Reformed smoker  History of aortic aneurysm -Stable on recent CT  Plan/Recommendations:  Continue Trelegy  Graded exercise as tolerated  Follow-up in a year    Sherrilyn Rist MD [facility] 06/10/2021, 10:07 AM  CC: Kathyrn Drown, MD

## 2021-06-10 NOTE — Patient Instructions (Signed)
I will see you a year from now  Continue with Trelegy  Regular exercises-20 to 30 minutes at least 4 to 5 days a week  Call us with any significant concerns

## 2021-06-15 ENCOUNTER — Encounter (HOSPITAL_COMMUNITY): Payer: Self-pay

## 2021-06-15 NOTE — Progress Notes (Signed)
Received referral for initial lung cancer screening scan. Call placed to patient who states that he is not interested at this time. Referral closed per patient request.

## 2021-07-01 DIAGNOSIS — H524 Presbyopia: Secondary | ICD-10-CM | POA: Diagnosis not present

## 2021-08-16 ENCOUNTER — Other Ambulatory Visit: Payer: Self-pay

## 2021-08-16 ENCOUNTER — Ambulatory Visit: Payer: BC Managed Care – PPO | Admitting: Family Medicine

## 2021-08-16 ENCOUNTER — Encounter: Payer: Self-pay | Admitting: Family Medicine

## 2021-08-16 VITALS — BP 122/70 | HR 75 | Temp 98.3°F | Wt 233.0 lb

## 2021-08-16 DIAGNOSIS — R252 Cramp and spasm: Secondary | ICD-10-CM

## 2021-08-16 DIAGNOSIS — I10 Essential (primary) hypertension: Secondary | ICD-10-CM | POA: Diagnosis not present

## 2021-08-16 DIAGNOSIS — I712 Thoracic aortic aneurysm, without rupture, unspecified: Secondary | ICD-10-CM | POA: Diagnosis not present

## 2021-08-16 MED ORDER — POTASSIUM CHLORIDE CRYS ER 10 MEQ PO TBCR: EXTENDED_RELEASE_TABLET | ORAL | 1 refills | Status: DC

## 2021-08-16 MED ORDER — AMLODIPINE BESYLATE 5 MG PO TABS: 5.0000 mg | ORAL_TABLET | Freq: Every day | ORAL | 1 refills | Status: DC

## 2021-08-16 MED ORDER — HYDROCHLOROTHIAZIDE 25 MG PO TABS: 25.0000 mg | ORAL_TABLET | Freq: Every day | ORAL | 1 refills | Status: DC

## 2021-08-16 NOTE — Progress Notes (Signed)
? ?  Subjective:  ? ? Patient ID: Derek Barrett, male    DOB: 1965/06/09, 56 y.o.   MRN: [id number] ? ?HPI ?Pt following up on blood pressure. Pt states blood pressure has been doing well. Pt states no symptoms. Taking all meds.  ? ?Pt states he has had some cramps in the morning times-cramps in left leg more that right-have worsened over the past 6 months.  ?Does relate some muscle cramps ?States his overall energy level doing good ? ? ? ?Review of Systems ? ?   ?Objective:  ? Physical Exam ?General-in no acute distress ?Eyes-no discharge ?Lungs-respiratory rate normal, CTA ?CV-no murmurs,RRR ?Extremities skin warm dry no edema ?Neuro grossly normal ?Behavior normal, alert ?Pulses in feet good ? ? ? ?   ?Assessment & Plan:  ?HTN-good control continue current measures healthy diet recommended ? ?Also with cramping stretching exercises recommended healthy diet decent hydration bump up potassium to 2 of the 10 mill equivalents daily finally repeat metabolic 7 and magnesium in the next few weeks ? ?Wellness exam by the end of August start of September ? ?Thoracic aortic aneurysm follow-up imaging in the fall time ? ?

## 2021-08-16 NOTE — Patient Instructions (Signed)
DASH Eating Plan °DASH stands for Dietary Approaches to Stop Hypertension. The DASH eating plan is a healthy eating plan that has been shown to: °Reduce high blood pressure (hypertension). °Reduce your risk for type 2 diabetes, heart disease, and stroke. °Help with weight loss. °What are tips for following this plan? °Reading food labels °Check food labels for the amount of salt (sodium) per serving. Choose foods with less than 5 percent of the Daily Value of sodium. Generally, foods with less than 300 milligrams (mg) of sodium per serving fit into this eating plan. °To find whole grains, look for the word "whole" as the first word in the ingredient list. °Shopping °Buy products labeled as "low-sodium" or "no salt added." °Buy fresh foods. Avoid canned foods and pre-made or frozen meals. °Cooking °Avoid adding salt when cooking. Use salt-free seasonings or herbs instead of table salt or sea salt. Check with your health care provider or pharmacist before using salt substitutes. °Do not fry foods. Cook foods using healthy methods such as baking, boiling, grilling, roasting, and broiling instead. °Cook with heart-healthy oils, such as olive, canola, avocado, soybean, or sunflower oil. °Meal planning ° °Eat a balanced diet that includes: °4 or more servings of fruits and 4 or more servings of vegetables each day. Try to fill one-half of your plate with fruits and vegetables. °6-8 servings of whole grains each day. °Less than 6 oz (170 g) of lean meat, poultry, or fish each day. A 3-oz (85-g) serving of meat is about the same size as a deck of cards. One egg equals 1 oz (28 g). °2-3 servings of low-fat dairy each day. One serving is 1 cup (237 mL). °1 serving of nuts, seeds, or beans 5 times each week. °2-3 servings of heart-healthy fats. Healthy fats called omega-3 fatty acids are found in foods such as walnuts, flaxseeds, fortified milks, and eggs. These fats are also found in cold-water fish, such as sardines, salmon,  and mackerel. °Limit how much you eat of: °Canned or prepackaged foods. °Food that is high in trans fat, such as some fried foods. °Food that is high in saturated fat, such as fatty meat. °Desserts and other sweets, sugary drinks, and other foods with added sugar. °Full-fat dairy products. °Do not salt foods before eating. °Do not eat more than 4 egg yolks a week. °Try to eat at least 2 vegetarian meals a week. °Eat more home-cooked food and less restaurant, buffet, and fast food. °Lifestyle °When eating at a restaurant, ask that your food be prepared with less salt or no salt, if possible. °If you drink alcohol: °Limit how much you use to: °0-1 drink a day for women who are not pregnant. °0-2 drinks a day for men. °Be aware of how much alcohol is in your drink. In the [place] one drink equals one 12 oz bottle of beer (355 mL), one 5 oz glass of wine (148 mL), or one 1½ oz glass of hard liquor (44 mL). °General information °Avoid eating more than 2,300 mg of salt a day. If you have hypertension, you may need to reduce your sodium intake to 1,500 mg a day. °Work with your health care provider to maintain a healthy body weight or to lose weight. Ask what an ideal weight is for you. °Get at least 30 minutes of exercise that causes your heart to beat faster (aerobic exercise) most days of the week. Activities may include walking, swimming, or biking. °Work with your health care provider or dietitian to   adjust your eating plan to your individual calorie needs. °What foods should I eat? °Fruits °All fresh, dried, or frozen fruit. Canned fruit in natural juice (without added sugar). °Vegetables °Fresh or frozen vegetables (raw, steamed, roasted, or grilled). Low-sodium or reduced-sodium tomato and vegetable juice. Low-sodium or reduced-sodium tomato sauce and tomato paste. Low-sodium or reduced-sodium canned vegetables. °Grains °Whole-grain or whole-wheat bread. Whole-grain or whole-wheat pasta. Brown rice. Oatmeal. Quinoa.  Bulgur. Whole-grain and low-sodium cereals. Pita bread. Low-fat, low-sodium crackers. Whole-wheat flour tortillas. °Meats and other proteins °Skinless chicken or turkey. Ground chicken or turkey. Pork with fat trimmed off. Fish and seafood. Egg whites. Dried beans, peas, or lentils. Unsalted nuts, nut butters, and seeds. Unsalted canned beans. Lean cuts of beef with fat trimmed off. Low-sodium, lean precooked or cured meat, such as sausages or meat loaves. °Dairy °Low-fat (1%) or fat-free (skim) milk. Reduced-fat, low-fat, or fat-free cheeses. Nonfat, low-sodium ricotta or cottage cheese. Low-fat or nonfat yogurt. Low-fat, low-sodium cheese. °Fats and oils °Soft margarine without trans fats. Vegetable oil. Reduced-fat, low-fat, or light mayonnaise and salad dressings (reduced-sodium). Canola, safflower, olive, avocado, soybean, and sunflower oils. Avocado. °Seasonings and condiments °Herbs. Spices. Seasoning mixes without salt. °Other foods °Unsalted popcorn and pretzels. Fat-free sweets. °The items listed above may not be a complete list of foods and beverages you can eat. Contact a dietitian for more information. °What foods should I avoid? °Fruits °Canned fruit in a light or heavy syrup. Fried fruit. Fruit in cream or butter sauce. °Vegetables °Creamed or fried vegetables. Vegetables in a cheese sauce. Regular canned vegetables (not low-sodium or reduced-sodium). Regular canned tomato sauce and paste (not low-sodium or reduced-sodium). Regular tomato and vegetable juice (not low-sodium or reduced-sodium). Pickles. Olives. °Grains °Baked goods made with fat, such as croissants, muffins, or some breads. Dry pasta or rice meal packs. °Meats and other proteins °Fatty cuts of meat. Ribs. Fried meat. Bacon. Bologna, salami, and other precooked or cured meats, such as sausages or meat loaves. Fat from the back of a pig (fatback). Bratwurst. Salted nuts and seeds. Canned beans with added salt. Canned or smoked fish.  Whole eggs or egg yolks. Chicken or turkey with skin. °Dairy °Whole or 2% milk, cream, and half-and-half. Whole or full-fat cream cheese. Whole-fat or sweetened yogurt. Full-fat cheese. Nondairy creamers. Whipped toppings. Processed cheese and cheese spreads. °Fats and oils °Butter. Stick margarine. Lard. Shortening. Ghee. Bacon fat. Tropical oils, such as coconut, palm kernel, or palm oil. °Seasonings and condiments °Onion salt, garlic salt, seasoned salt, table salt, and sea salt. Worcestershire sauce. Tartar sauce. Barbecue sauce. Teriyaki sauce. Soy sauce, including reduced-sodium. Steak sauce. Canned and packaged gravies. Fish sauce. Oyster sauce. Cocktail sauce. Store-bought horseradish. Ketchup. Mustard. Meat flavorings and tenderizers. Bouillon cubes. Hot sauces. Pre-made or packaged marinades. Pre-made or packaged taco seasonings. Relishes. Regular salad dressings. °Other foods °Salted popcorn and pretzels. °The items listed above may not be a complete list of foods and beverages you should avoid. Contact a dietitian for more information. °Where to find more information °National Heart, Lung, and Blood Institute: www.nhlbi.nih.gov °American Heart Association: www.heart.org °Academy of Nutrition and Dietetics: www.eatright.org °National Kidney Foundation: www.kidney.org °Summary °The DASH eating plan is a healthy eating plan that has been shown to reduce high blood pressure (hypertension). It may also reduce your risk for type 2 diabetes, heart disease, and stroke. °When on the DASH eating plan, aim to eat more fresh fruits and vegetables, whole grains, lean proteins, low-fat dairy, and heart-healthy fats. °With the DASH   eating plan, you should limit salt (sodium) intake to 2,300 mg a day. If you have hypertension, you may need to reduce your sodium intake to 1,500 mg a day. °Work with your health care provider or dietitian to adjust your eating plan to your individual calorie needs. °This information is not  intended to replace advice given to you by your health care provider. Make sure you discuss any questions you have with your health care provider. °Document Revised: 04/26/2019 Document Reviewed: 04/26/2019 °Elsevier Patient Education © 2022 [employer]vier Inc. ° °

## 2021-08-17 ENCOUNTER — Encounter: Payer: Self-pay | Admitting: Family Medicine

## 2021-08-17 LAB — BASIC METABOLIC PANEL
BUN/Creatinine Ratio: 16 (ref 9–20)
BUN: 17 mg/dL (ref 6–24)
CO2: 24 mmol/L (ref 20–29)
Calcium: 10.2 mg/dL (ref 8.7–10.2)
Chloride: 100 mmol/L (ref 96–106)
Creatinine, Ser: 1.09 mg/dL (ref 0.76–1.27)
Glucose: 115 mg/dL — ABNORMAL HIGH (ref 70–99)
Potassium: 4.1 mmol/L (ref 3.5–5.2)
Sodium: 140 mmol/L (ref 134–144)
eGFR: 80 mL/min/{1.73_m2} (ref >59)

## 2021-08-17 LAB — MAGNESIUM: Magnesium: 2.2 mg/dL (ref 1.6–2.3)

## 2021-09-28 ENCOUNTER — Telehealth: Payer: Self-pay | Admitting: Pulmonary Disease

## 2021-09-28 MED ORDER — TRELEGY ELLIPTA 100-62.5-25 MCG/ACT IN AEPB: 1.0000 | INHALATION_SPRAY | Freq: Every day | RESPIRATORY_TRACT | 2 refills | Status: DC

## 2021-09-28 NOTE — Telephone Encounter (Signed)
I called the patient to verify which pharmacy and he wanted the trelegy sent to [facility] [facility] [address] and I sent the refill. Nothing further needed. ?

## 2022-02-16 ENCOUNTER — Encounter: Payer: Self-pay | Admitting: Family Medicine

## 2022-02-16 ENCOUNTER — Ambulatory Visit (INDEPENDENT_AMBULATORY_CARE_PROVIDER_SITE_OTHER): Payer: BC Managed Care – PPO | Admitting: Family Medicine

## 2022-02-16 VITALS — BP 122/72 | Ht 70.0 in | Wt 228.8 lb

## 2022-02-16 DIAGNOSIS — Z0000 Encounter for general adult medical examination without abnormal findings: Secondary | ICD-10-CM | POA: Diagnosis not present

## 2022-02-16 DIAGNOSIS — Z125 Encounter for screening for malignant neoplasm of prostate: Secondary | ICD-10-CM

## 2022-02-16 DIAGNOSIS — E538 Deficiency of other specified B group vitamins: Secondary | ICD-10-CM

## 2022-02-16 DIAGNOSIS — Z23 Encounter for immunization: Secondary | ICD-10-CM

## 2022-02-16 DIAGNOSIS — I10 Essential (primary) hypertension: Secondary | ICD-10-CM | POA: Diagnosis not present

## 2022-02-16 DIAGNOSIS — I7121 Aneurysm of the ascending aorta, without rupture: Secondary | ICD-10-CM | POA: Diagnosis not present

## 2022-02-16 MED ORDER — POTASSIUM CHLORIDE CRYS ER 10 MEQ PO TBCR: EXTENDED_RELEASE_TABLET | ORAL | 1 refills | Status: DC

## 2022-02-16 MED ORDER — TRELEGY ELLIPTA 100-62.5-25 MCG/ACT IN AEPB: 1.0000 | INHALATION_SPRAY | Freq: Every day | RESPIRATORY_TRACT | 2 refills | Status: DC

## 2022-02-16 MED ORDER — AMLODIPINE BESYLATE 5 MG PO TABS: 5.0000 mg | ORAL_TABLET | Freq: Every day | ORAL | 1 refills | Status: DC

## 2022-02-16 MED ORDER — HYDROCHLOROTHIAZIDE 25 MG PO TABS: 25.0000 mg | ORAL_TABLET | Freq: Every day | ORAL | 1 refills | Status: DC

## 2022-02-16 NOTE — Progress Notes (Signed)
   Subjective:    Patient ID: SAMARION EHLE, male    DOB: 10-26-1965, 56 y.o.   MRN: [id number]  HPI The patient comes in today for a wellness visit. Patient states he is trying to eat healthy Stays active in the yard but does not do any purposeful walking Cardiovascular fitness was discussed Denies any chest tightness shortness of breath No rectal bleeding or hematuria Denies any rash or redness    A review of their health history was completed.  A review of medications was also completed.  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   Additional concerns: Blood pressure follow up- pt states blood pressure has been OK.  Pt also wanting to know if he needs Tetanus shot updated.   Review of Systems     Objective:   Physical Exam  General-in no acute distress Eyes-no discharge Lungs-respiratory rate normal, CTA CV-no murmurs,RRR Extremities skin warm dry no edema Neuro grossly normal Behavior normal, alert       Assessment & Plan:   1. Essential hypertension Blood pressure good control continue current measures healthy diet stay away from tobacco minimize alcohol to 2 beers or less - PSA - Lipid panel - Hepatic function panel - Basic metabolic panel - Vitamin H60 - Tdap vaccine greater than or equal to 7yo IM - Flu Vaccine QUAD 6+ mos PF IM (Fluarix Quad PF)  2. Aneurysm of ascending aorta without rupture [facility]) Patient will do a follow-up CAT scan imaging in October - PSA - Lipid panel - Hepatic function panel - Basic metabolic panel - Vitamin V37  3. B12 deficiency Check B12 level continue oral B12 - PSA - Lipid panel - Hepatic function panel - Basic metabolic panel - Vitamin T06  4. Well adult exam Adult wellness-complete.wellness physical was conducted  today. Importance of diet and exercise were discussed in detail.  Importance of stress reduction and healthy living were discussed.  In addition to this a discussion regarding safety was also covered.  We also reviewed over immunizations and gave recommendations regarding current immunization needed for age.   In addition to this additional areas were also touched on including: Preventative health exams needed:  Colonoscopy next 1 is 2026  Patient was advised yearly wellness exam  - PSA - Lipid panel - Hepatic function panel - Basic metabolic panel - Vitamin Y69  5. Screening for prostate cancer PSA Prostate slightly enlarged which is normal for his age not hard it is soft - PSA - Lipid panel - Hepatic function panel - Basic metabolic panel - Vitamin S85  6. Need for vaccination Today Also recommended COVID-vaccine and shingles vaccine - Tdap vaccine greater than or equal to 7yo IM - Flu Vaccine QUAD 6+ mos PF IM (Fluarix Quad PF)

## 2022-02-17 ENCOUNTER — Telehealth: Payer: Self-pay

## 2022-02-17 ENCOUNTER — Other Ambulatory Visit: Payer: Self-pay

## 2022-02-17 DIAGNOSIS — I7121 Aneurysm of the ascending aorta, without rupture: Secondary | ICD-10-CM

## 2022-02-17 NOTE — Progress Notes (Signed)
Message left for a return call for details per drs notes for repeat CT angio .

## 2022-02-17 NOTE — Telephone Encounter (Signed)
Left message for patient to return the call for additional details and recommendations.   

## 2022-02-17 NOTE — Telephone Encounter (Signed)
-----   Message from Kathyrn Drown, MD sent at 02/16/2022  7:15 PM EDT ----- Will need repeat of the CT angio of the aorta thoracic please order the exact same CT that was completed last October 2022.  This CAT scan needs to be completed in October he prefers mid October or November either 1 would be fine he desires to avoid the last week of October

## 2022-02-18 NOTE — Progress Notes (Signed)
02/17/22-pt returned call and verbalized understanding. CT scan order placed and Mid October placed in scheduling comments.

## 2022-02-18 NOTE — Addendum Note (Signed)
Addended by: Vicente Males on: 02/18/2022 09:14 AM   Modules accepted: Orders

## 2022-02-27 ENCOUNTER — Ambulatory Visit (HOSPITAL_BASED_OUTPATIENT_CLINIC_OR_DEPARTMENT_OTHER): Payer: BC Managed Care – PPO

## 2022-03-04 DIAGNOSIS — Z125 Encounter for screening for malignant neoplasm of prostate: Secondary | ICD-10-CM | POA: Diagnosis not present

## 2022-03-04 DIAGNOSIS — I7121 Aneurysm of the ascending aorta, without rupture: Secondary | ICD-10-CM | POA: Diagnosis not present

## 2022-03-04 DIAGNOSIS — E538 Deficiency of other specified B group vitamins: Secondary | ICD-10-CM | POA: Diagnosis not present

## 2022-03-04 DIAGNOSIS — Z0000 Encounter for general adult medical examination without abnormal findings: Secondary | ICD-10-CM | POA: Diagnosis not present

## 2022-03-04 DIAGNOSIS — I10 Essential (primary) hypertension: Secondary | ICD-10-CM | POA: Diagnosis not present

## 2022-03-05 LAB — HEPATIC FUNCTION PANEL
ALT: 19 IU/L (ref 0–44)
AST: 21 IU/L (ref 0–40)
Albumin: 4.9 g/dL (ref 3.8–4.9)
Alkaline Phosphatase: 95 IU/L (ref 44–121)
Bilirubin Total: 0.7 mg/dL (ref 0.0–1.2)
Bilirubin, Direct: 0.23 mg/dL (ref 0.00–0.40)
Total Protein: 7.4 g/dL (ref 6.0–8.5)

## 2022-03-05 LAB — BASIC METABOLIC PANEL
BUN/Creatinine Ratio: 14 (ref 9–20)
BUN: 17 mg/dL (ref 6–24)
CO2: 23 mmol/L (ref 20–29)
Calcium: 10.1 mg/dL (ref 8.7–10.2)
Chloride: 98 mmol/L (ref 96–106)
Creatinine, Ser: 1.19 mg/dL (ref 0.76–1.27)
Glucose: 105 mg/dL — ABNORMAL HIGH (ref 70–99)
Potassium: 4.4 mmol/L (ref 3.5–5.2)
Sodium: 136 mmol/L (ref 134–144)
eGFR: 72 mL/min/{1.73_m2} (ref >59)

## 2022-03-05 LAB — PSA: Prostate Specific Ag, Serum: 0.8 ng/mL (ref 0.0–4.0)

## 2022-03-05 LAB — LIPID PANEL
Chol/HDL Ratio: 2.6 ratio (ref 0.0–5.0)
Cholesterol, Total: 240 mg/dL — ABNORMAL HIGH (ref 100–199)
HDL: 91 mg/dL (ref >39)
LDL Chol Calc (NIH): 138 mg/dL — ABNORMAL HIGH (ref 0–99)
Triglycerides: 66 mg/dL (ref 0–149)
VLDL Cholesterol Cal: 11 mg/dL (ref 5–40)

## 2022-03-05 LAB — VITAMIN B12: Vitamin B-12: 647 pg/mL (ref 232–1245)

## 2022-04-15 ENCOUNTER — Ambulatory Visit (HOSPITAL_BASED_OUTPATIENT_CLINIC_OR_DEPARTMENT_OTHER)
Admission: RE | Admit: 2022-04-15 | Discharge: 2022-04-15 | Disposition: A | Discharge status: Home / Self Care | Payer: BC Managed Care – PPO | Source: Ambulatory Visit | Attending: Family Medicine | Admitting: Family Medicine

## 2022-04-15 DIAGNOSIS — I7121 Aneurysm of the ascending aorta, without rupture: Secondary | ICD-10-CM | POA: Diagnosis not present

## 2022-04-15 MED ORDER — IOHEXOL 350 MG/ML SOLN
75.0000 mL | Freq: Once | INTRAVENOUS | Status: AC | PRN
  Administered 2022-04-15: 75 mL via INTRAVENOUS

## 2022-06-01 ENCOUNTER — Other Ambulatory Visit: Payer: Self-pay | Admitting: Family Medicine

## 2022-07-07 ENCOUNTER — Encounter: Payer: Self-pay | Admitting: Pulmonary Disease

## 2022-07-07 ENCOUNTER — Ambulatory Visit: Payer: BC Managed Care – PPO | Admitting: Pulmonary Disease

## 2022-07-07 VITALS — BP 120/78 | HR 70 | Ht 72.0 in | Wt 228.0 lb

## 2022-07-07 DIAGNOSIS — J4489 Other specified chronic obstructive pulmonary disease: Secondary | ICD-10-CM | POA: Diagnosis not present

## 2022-07-07 DIAGNOSIS — J439 Emphysema, unspecified: Secondary | ICD-10-CM

## 2022-07-07 NOTE — Progress Notes (Signed)
Derek Barrett    [id number]    05-08-1966  Primary Care Physician:Luking, Elayne Snare, MD  Referring Physician: Kathyrn Drown, MD [facility] [place],  [place] [address]  Chief complaint:   Shortness of breath on exertion  HPI:  Has been stable over the last year  Continues to use Trelegy on a regular basis Feels breathing is stable  Denies a cough, denies shortness of breath on exertion  Has not really needed his rescue inhaler  Quit smoking about 2 years ago About 30-pack-year smoking history  History of hypertension, allergies  Worked in [employer]  Outpatient Encounter Medications as of 07/07/2022  Medication Sig   amLODipine (NORVASC) 5 MG tablet Take 1 tablet (5 mg total) by mouth daily.   fexofenadine (ALLEGRA) 180 MG tablet Take 180 mg by mouth daily.   Fluticasone-Umeclidin-Vilant (TRELEGY ELLIPTA) 100-62.5-25 MCG/ACT AEPB INHALE ONE PUFF ONCE DAILY   hydrochlorothiazide (HYDRODIURIL) 25 MG tablet Take 1 tablet (25 mg total) by mouth daily.   potassium chloride (KLOR-CON M) 10 MEQ tablet 2 qam   vitamin B-12 (CYANOCOBALAMIN) 500 MCG tablet Take 500 mcg by mouth daily.   albuterol (VENTOLIN HFA) 108 (90 Base) MCG/ACT inhaler INHALE 2 PUFFS INTO THE LUNGS EVERY 4 HOURS AS NEEDED FOR WHEEZING. (Patient not taking: Reported on 07/07/2022)   No facility-administered encounter medications on file as of 07/07/2022.    Allergies as of 07/07/2022 - Review Complete 07/07/2022  Allergen Reaction Noted   Penicillins Other (See Comments) 05/18/2015    Past Medical History:  Diagnosis Date   Aneurysm ([place])    B12 deficiency 05/04/2018   Patient's MCV was elevated, B12 low normal, instructed to do oral B12 supplementation, instructed to limit alcohol use   Hypertension     Past Surgical History:  Procedure Laterality Date   ADENOIDECTOMY     COLONOSCOPY WITH PROPOFOL N/A 04/03/2020   Procedure: COLONOSCOPY WITH PROPOFOL;  Surgeon: Eloise Harman, DO;  [facility];  Service: Endoscopy;  Laterality: N/A;  10:00   POLYPECTOMY  04/03/2020   Procedure: POLYPECTOMY;  Surgeon: Eloise Harman, DO;  Location: [facility];  Service: Endoscopy;;   WISDOM TOOTH EXTRACTION      No family history on file.  Social History   Socioeconomic History   Marital status: Married    Spouse name: Not on file   Number of children: Not on file   Years of education: Not on file   Highest education level: Not on file  Occupational History   Not on file  Tobacco Use   Smoking status: Former    Packs/day: 1.00    Types: Cigarettes   Smokeless tobacco: Never   Tobacco comments:    1 pack a day since age 58  Vaping Use   Vaping Use: Never used  Substance and Sexual Activity   Alcohol use: Yes    Alcohol/week: 2.0 standard drinks of alcohol    Types: 2 Cans of beer per week   Drug use: Never   Sexual activity: Not on file  Other Topics Concern   Not on file  Social History Narrative   Not on file   Social Determinants of Health   Financial Resource Strain: Not on file  Food Insecurity: Not on file  Transportation Needs: Not on file  Physical Activity: Not on file  Stress: Not on file  Social Connections: Not on file  Intimate Partner  Violence: Not on file    Review of Systems  Respiratory:  Negative for cough and shortness of breath.     Vitals:   07/07/22 0923  BP: 120/78  Pulse: 70  SpO2: 97%     Physical Exam Constitutional:      Appearance: Normal appearance.  HENT:     Head: Normocephalic.     Mouth/Throat:     Mouth: Mucous membranes are moist.  Eyes:     General: No scleral icterus. Cardiovascular:     Rate and Rhythm: Normal rate and regular rhythm.     Heart sounds: No murmur heard.    No friction rub.  Pulmonary:     Effort: No respiratory distress.     Breath sounds: No stridor. No wheezing or rhonchi.  Musculoskeletal:     Cervical back: No rigidity or tenderness.   Neurological:     Mental Status: He is alert.  Psychiatric:        Mood and Affect: Mood normal.      Data Reviewed: Last CT was 05/10/2017-within normal limits, aortic aneurysm was the concern but was not confirmed on that CT Cardiac CT recently 03/16/2021-some emphysema  CT scan November 2023 reviewed with patient showing evidence of emphysema  Assessment:  .  Past history of smoking  .  Obstructive lung disease  .  Emphysema  .  Stable symptoms on Trelegy  .  History of aortic aneurysm -Stable on CT    Plan/Recommendations:  .  Continue Trelegy  .  Schedule for pulmonary function test  .  Follow-up in about 3 to 4 months, can have PFT done on the same day that he comes in  .  As he has been doing very well, if flow volumes are normal, can consider de-escalating inhaler use  .  Encouraged to call with significant concerns   Sherrilyn Rist MD [facility] 07/07/2022, 9:45 AM  CC: Kathyrn Drown, MD

## 2022-07-07 NOTE — Patient Instructions (Signed)
Pulmonary function test on the day of next visit  Schedule for appointment in May  Continue using Trelegy  Rescue inhaler use as needed  Call with significant concerns

## 2022-08-16 ENCOUNTER — Other Ambulatory Visit: Payer: Self-pay | Admitting: Family Medicine

## 2022-09-13 DIAGNOSIS — L308 Other specified dermatitis: Secondary | ICD-10-CM | POA: Diagnosis not present

## 2022-09-13 DIAGNOSIS — L570 Actinic keratosis: Secondary | ICD-10-CM | POA: Diagnosis not present

## 2022-09-13 DIAGNOSIS — X32XXXA Exposure to sunlight, initial encounter: Secondary | ICD-10-CM | POA: Diagnosis not present

## 2022-09-13 DIAGNOSIS — L119 Acantholytic disorder, unspecified: Secondary | ICD-10-CM | POA: Diagnosis not present

## 2022-09-13 DIAGNOSIS — L820 Inflamed seborrheic keratosis: Secondary | ICD-10-CM | POA: Diagnosis not present

## 2022-10-10 ENCOUNTER — Other Ambulatory Visit: Payer: Self-pay | Admitting: Family Medicine

## 2022-11-07 ENCOUNTER — Other Ambulatory Visit: Payer: Self-pay | Admitting: Family Medicine

## 2022-12-12 ENCOUNTER — Other Ambulatory Visit: Payer: Self-pay | Admitting: Family Medicine

## 2023-01-10 ENCOUNTER — Other Ambulatory Visit: Payer: Self-pay | Admitting: Family Medicine

## 2023-02-16 ENCOUNTER — Other Ambulatory Visit: Payer: Self-pay | Admitting: Family Medicine

## 2023-03-27 ENCOUNTER — Telehealth: Payer: Self-pay | Admitting: Pulmonary Disease

## 2023-03-27 MED ORDER — TRELEGY ELLIPTA 100-62.5-25 MCG/ACT IN AEPB: 1.0000 | INHALATION_SPRAY | Freq: Every day | RESPIRATORY_TRACT | 1 refills | Status: DC

## 2023-03-27 NOTE — Telephone Encounter (Signed)
PT calling for a refill of Trelegy. Pharm said new RX needed.  Pharm: [address] [place] [facility]  [phone]

## 2023-03-27 NOTE — Telephone Encounter (Signed)
Refill sent Patient has appt 12/13 PFT and f/u with [facility].

## 2023-03-28 DIAGNOSIS — L820 Inflamed seborrheic keratosis: Secondary | ICD-10-CM | POA: Diagnosis not present

## 2023-03-28 DIAGNOSIS — L281 Prurigo nodularis: Secondary | ICD-10-CM | POA: Diagnosis not present

## 2023-04-28 ENCOUNTER — Other Ambulatory Visit: Payer: Self-pay | Admitting: Family Medicine

## 2023-05-15 ENCOUNTER — Other Ambulatory Visit: Payer: Self-pay | Admitting: Family Medicine

## 2023-05-18 ENCOUNTER — Other Ambulatory Visit: Payer: Self-pay | Admitting: Family Medicine

## 2023-05-19 ENCOUNTER — Ambulatory Visit (HOSPITAL_BASED_OUTPATIENT_CLINIC_OR_DEPARTMENT_OTHER): Payer: BC Managed Care – PPO | Admitting: Pulmonary Disease

## 2023-05-19 ENCOUNTER — Encounter: Payer: Self-pay | Admitting: Primary Care

## 2023-05-19 ENCOUNTER — Other Ambulatory Visit (HOSPITAL_BASED_OUTPATIENT_CLINIC_OR_DEPARTMENT_OTHER): Payer: Self-pay

## 2023-05-19 ENCOUNTER — Ambulatory Visit: Payer: BC Managed Care – PPO | Admitting: Primary Care

## 2023-05-19 VITALS — BP 124/80 | HR 78 | Temp 97.9°F | Ht 72.0 in | Wt 226.0 lb

## 2023-05-19 DIAGNOSIS — J4489 Other specified chronic obstructive pulmonary disease: Secondary | ICD-10-CM

## 2023-05-19 DIAGNOSIS — J439 Emphysema, unspecified: Secondary | ICD-10-CM

## 2023-05-19 DIAGNOSIS — J449 Chronic obstructive pulmonary disease, unspecified: Secondary | ICD-10-CM

## 2023-05-19 HISTORY — DX: Chronic obstructive pulmonary disease, unspecified: J44.9

## 2023-05-19 LAB — PULMONARY FUNCTION TEST
DL/VA % pred: 104 %
DL/VA: 4.46 ml/min/mmHg/L
DLCO cor % pred: 112 %
DLCO cor: 33.56 ml/min/mmHg
DLCO unc % pred: 112 %
DLCO unc: 33.56 ml/min/mmHg
FEF 25-75 Post: 1.88 L/s
FEF 25-75 Pre: 1.4 L/s
FEF2575-%Change-Post: 33 %
FEF2575-%Pred-Post: 56 %
FEF2575-%Pred-Pre: 42 %
FEV1-%Change-Post: 11 %
FEV1-%Pred-Post: 71 %
FEV1-%Pred-Pre: 64 %
FEV1-Post: 2.85 L
FEV1-Pre: 2.56 L
FEV1FVC-%Change-Post: 3 %
FEV1FVC-%Pred-Pre: 78 %
FEV6-%Change-Post: 8 %
FEV6-%Pred-Post: 91 %
FEV6-%Pred-Pre: 84 %
FEV6-Post: 4.55 L
FEV6-Pre: 4.21 L
FEV6FVC-%Change-Post: 0 %
FEV6FVC-%Pred-Post: 103 %
FEV6FVC-%Pred-Pre: 103 %
FVC-%Change-Post: 7 %
FVC-%Pred-Post: 87 %
FVC-%Pred-Pre: 81 %
FVC-Post: 4.58 L
FVC-Pre: 4.25 L
Post FEV1/FVC ratio: 62 %
Post FEV6/FVC ratio: 99 %
Pre FEV1/FVC ratio: 60 %
Pre FEV6/FVC Ratio: 99 %
RV % pred: 189 %
RV: 4.34 L
TLC % pred: 126 %
TLC: 9.35 L

## 2023-05-19 MED ORDER — TRELEGY ELLIPTA 100-62.5-25 MCG/ACT IN AEPB: 1.0000 | INHALATION_SPRAY | Freq: Every day | RESPIRATORY_TRACT | 5 refills | Status: DC

## 2023-05-19 NOTE — Progress Notes (Signed)
Full PFT Performed Today  

## 2023-05-19 NOTE — Patient Instructions (Signed)
-COPD/EMPHYSEMA: Chronic Obstructive Pulmonary Disease (COPD) and emphysema are long-term lung conditions that cause breathing difficulties. You have mild obstruction noted on your pulmonary function test, but you are managing well with your current treatment. Continue using your Trelegy inhaler as prescribed, stay active, maintain a healthy weight, and keep up with your vaccinations. We also discussed the option of lung cancer screening due to your smoking history, and you may want to discuss this further with your spouse. Recommend you consider referral to lung cancer screening program due to smoking history, this is where you would get an annual low dose CT of your chest to screen for lung cancer   Follow-up: Please follow up in 6 months with Dr. Wynona Neat or sooner if you experience any changes in symptoms or have an upper respiratory infection that does not improve with over-the-counter medication.  Chronic Obstructive Pulmonary Disease  Chronic obstructive pulmonary disease (COPD) is a long-term (chronic) lung problem. When you have COPD, it is hard for air to get in and out of your lungs. Usually the condition gets worse over time, and your lungs will never return to normal. There are things you can do to keep yourself as healthy as possible. What are the causes? Smoking. This is the most common cause. Certain genes passed from parent to child (inherited). What increases the risk? Being exposed to secondhand smoke from cigarettes, pipes, or cigars. Being exposed to chemicals and other irritants, such as fumes and dust in the work environment. Having chronic lung conditions or infections. What are the signs or symptoms? Shortness of breath, especially during physical activity. A long-term cough with a large amount of thick mucus. Sometimes, the cough may not have any mucus (dry cough). Wheezing. Breathing quickly. Skin that looks gray or blue, especially in the fingers, toes, or  lips. Feeling tired (fatigue). Weight loss. Chest tightness. Having infections often. Episodes when breathing symptoms become much worse (exacerbations). At the later stages of this disease, you may have swelling in the ankles, feet, or legs. How is this treated? Taking medicines. Quitting smoking, if you smoke. Rehabilitation. This includes steps to make your body work better. It may involve a team of specialists. Doing exercises. Making changes to your diet. Using oxygen. Lung surgery. Lung transplant. Comfort measures (palliative care). Follow these instructions at home: Medicines Take over-the-counter and prescription medicines only as told by your doctor. Talk to your doctor before taking any cough or allergy medicines. You may need to avoid medicines that cause your lungs to be dry. Lifestyle If you smoke, stop smoking. Smoking makes the problem worse. Do not smoke or use any products that contain nicotine or tobacco. If you need help quitting, ask your doctor. Avoid being around things that make your breathing worse. This may include smoke, chemicals, and fumes. Stay active, but remember to rest as well. Learn and use tips on how to manage stress and control your breathing. Make sure you get enough sleep. Most adults need at least 7 hours of sleep every night. Eat healthy foods. Eat smaller meals more often. Rest before meals. Controlled breathing Learn and use tips on how to control your breathing as told by your doctor. Try: Breathing in (inhaling) through your nose for 1 second. Then, pucker your lips and breath out (exhale) through your lips for 2 seconds. Putting one hand on your belly (abdomen). Breathe in slowly through your nose for 1 second. Your hand on your belly should move out. Pucker your lips and breathe out  slowly through your lips. Your hand on your belly should move in as you breathe out.  Controlled coughing Learn and use controlled coughing to clear mucus  from your lungs. Follow these steps: Lean your head a little forward. Breathe in deeply. Try to hold your breath for 3 seconds. Keep your mouth slightly open while coughing 2 times. Spit any mucus out into a tissue. Rest and do the steps again 1 or 2 times as needed. General instructions Make sure you get all the shots (vaccines) that your doctor recommends. Ask your doctor about a flu shot and a pneumonia shot. Use oxygen therapy and pulmonary rehabilitation if told by your doctor. If you need home oxygen therapy, ask your doctor if you should buy a tool to measure your oxygen level (oximeter). Make a COPD action plan with your doctor. This helps you to know what to do if you feel worse than usual. Manage any other conditions you have as told by your doctor. Avoid going outside when it is very hot, cold, or humid. Avoid people who have a sickness you can catch (contagious). Keep all follow-up visits. Contact a doctor if: You cough up more mucus than usual. There is a change in the color or thickness of the mucus. It is harder to breathe than usual. Your breathing is faster than usual. You have trouble sleeping. You need to use your medicines more often than usual. You have trouble doing your normal activities such as getting dressed or walking around the house. Get help right away if: You have shortness of breath while resting. You have shortness of breath that stops you from: Being able to talk. Doing normal activities. Your chest hurts for longer than 5 minutes. Your skin color is more blue than usual. Your pulse oximeter shows that you have low oxygen for longer than 5 minutes. You have a fever. You feel too tired to breathe normally. These symptoms may represent a serious problem that is an emergency. Do not wait to see if the symptoms will go away. Get medical help right away. Call your local emergency services (911 in the [place] Do not drive yourself to the  hospital. Summary Chronic obstructive pulmonary disease (COPD) is a long-term lung problem. The way your lungs work will never return to normal. Usually the condition gets worse over time. There are things you can do to keep yourself as healthy as possible. Take over-the-counter and prescription medicines only as told by your doctor. If you smoke, stop. Smoking makes the problem worse. This information is not intended to replace advice given to you by your health care provider. Make sure you discuss any questions you have with your health care provider. Document Revised: 03/30/2020 Document Reviewed: 03/31/2020 Elsevier Patient Education  2024 [employer].

## 2023-05-19 NOTE — Patient Instructions (Signed)
Full PFT Performed Today  

## 2023-05-19 NOTE — Progress Notes (Signed)
@Patient  ID: Derek Barrett, male    DOB: 12/04/1965, 57 y.o.   MRN: [id number]  Chief Complaint  Patient presents with   Follow-up    PFT today. Breathing is doing well. Occ prod cough with clear sputum in the morning.     Referring provider: Babs Sciara, MD  HPI: 57 year old, former smoker. PMH significant for HTN, B12 deficiency, thoracic aortic aneurysm.   05/19/2023 Discussed the use of AI scribe software for clinical note transcription with the patient, who gave verbal consent to proceed.  History of Present Illness   The patient, with a history of emphysema and obstructive lung disease secondary to a significant smoking history, presents for a follow-up visit. The patient quit smoking four years ago and has been compliant with his prescribed Trelegy inhaler for COPD/emphysema management. No prior breathing test until the current visit.  The patient reports a daily cough, which is more pronounced in the mornings and is associated with mucus production. He also reports some occasional wheezing/chest tightness, rated at 2 out of 5. Despite these symptoms, the patient is able to maintain his daily activities without significant limitation. He reports no sleep disturbances related to his lung condition and has an energy level rated at 3 out of 5. He has not needed to use a rescue inhaler since starting the Trelegy.  He has expressed interest in discussing the lung cancer screening program with his spouse before making a decision. The patient's last CT scan in November 2023 showed emphysema but no suspicious pulmonary nodules or masses.      Pulmonary function testing: 05/19/2023 PFT>> FVC 4.58 (115%), FEV1 2.85 (93%), ratio 62, DLCO 142% Mild obstruction with borderline BD response, mild curvature on flow vol loop      Allergies  Allergen Reactions   Penicillins Other (See Comments)    Reaction: Unknown family History    Immunization History  Administered Date(s)  Administered   Influenza,inj,Quad PF,6+ Mos 02/15/2021, 02/16/2022   Influenza-Unspecified 04/15/2019   Moderna Sars-Covid-2 Vaccination 08/19/2019, 09/19/2019, 11/18/2020   Tdap 02/16/2022    Past Medical History:  Diagnosis Date   Aneurysm ([facility])    B12 deficiency 05/04/2018   Patient's MCV was elevated, B12 low normal, instructed to do oral B12 supplementation, instructed to limit alcohol use   Hypertension     Tobacco History: Social History   Tobacco Use  Smoking Status Former   Current packs/day: 1.00   Types: Cigarettes  Smokeless Tobacco Never  Tobacco Comments   1 pack a day since age 52   Counseling given: Not Answered Tobacco comments: 1 pack a day since age 51   Outpatient Medications Prior to Visit  Medication Sig Dispense Refill   amLODipine (NORVASC) 5 MG tablet TAKE ONE TABLET BY MOUTH ONCE DAILY 30 tablet 0   fexofenadine (ALLEGRA) 180 MG tablet Take 180 mg by mouth daily.     Fluticasone-Umeclidin-Vilant (TRELEGY ELLIPTA) 100-62.5-25 MCG/ACT AEPB Inhale 1 puff into the lungs daily. 60 each 1   hydrochlorothiazide (HYDRODIURIL) 25 MG tablet TAKE ONE TABLET BY MOUTH ONCE DAILY 30 tablet 0   potassium chloride (KLOR-CON) 10 MEQ tablet TAKE TWO TABLETS BY MOUTH EVERY MORNING 60 tablet 0   vitamin B-12 (CYANOCOBALAMIN) 500 MCG tablet Take 500 mcg by mouth daily.     albuterol (VENTOLIN HFA) 108 (90 Base) MCG/ACT inhaler INHALE 2 PUFFS INTO THE LUNGS EVERY 4 HOURS AS NEEDED FOR WHEEZING. (Patient not taking: Reported on 07/07/2022) 18 each 1  No facility-administered medications prior to visit.    Review of Systems  Review of Systems  Constitutional: Negative.   HENT: Negative.    Respiratory:  Positive for cough. Negative for chest tightness, shortness of breath and wheezing.   Cardiovascular: Negative.    Physical Exam  BP 124/80 (BP Location: Left Arm, Patient Position: Sitting, Cuff Size: Normal)   Pulse 78   Temp 97.9 F (36.6 C) (Temporal)   Ht  6' (1.829 m)   Wt 226 lb (102.5 kg)   SpO2 96%   BMI 30.65 kg/m  Physical Exam Constitutional:      Appearance: Normal appearance. He is obese. He is not ill-appearing.  HENT:     Head: Normocephalic and atraumatic.     Mouth/Throat:     Mouth: Mucous membranes are moist.     Pharynx: Oropharynx is clear.  Cardiovascular:     Rate and Rhythm: Normal rate and regular rhythm.  Pulmonary:     Effort: Pulmonary effort is normal. No respiratory distress.     Breath sounds: Normal breath sounds. No wheezing, rhonchi or rales.  Skin:    General: Skin is warm and dry.  Neurological:     General: No focal deficit present.     Mental Status: He is alert and oriented to person, place, and time. Mental status is at baseline.  Psychiatric:        Mood and Affect: Mood normal.        Behavior: Behavior normal.        Thought Content: Thought content normal.        Judgment: Judgment normal.      Lab Results:  CBC    Component Value Date/Time   WBC 5.6 03/04/2018 1847   RBC 4.92 03/04/2018 1847   HGB 17.5 03/04/2018 1847   HGB 15.4 03/16/2016 0943   HCT 49.4 03/04/2018 1847   HCT 43.7 03/16/2016 0943   PLT 223 03/04/2018 1847   PLT 218 03/16/2016 0943   MCV 100.4 (H) 03/04/2018 1847   MCV 95 03/16/2016 0943   MCH 35.5 (H) 03/04/2018 1847   MCHC 35.3 03/04/2018 1847   RDW 12.7 03/04/2018 1847   RDW 12.7 03/16/2016 0943   LYMPHSABS 2.3 03/04/2018 1847   LYMPHSABS 1.6 03/16/2016 0943   MONOABS 0.6 03/04/2018 1847   EOSABS 0.2 03/04/2018 1847   EOSABS 0.2 03/16/2016 0943   BASOSABS 0.0 03/04/2018 1847   BASOSABS 0.0 03/16/2016 0943    BMET    Component Value Date/Time   NA 136 03/04/2022 1009   K 4.4 03/04/2022 1009   CL 98 03/04/2022 1009   CO2 23 03/04/2022 1009   GLUCOSE 105 (H) 03/04/2022 1009   GLUCOSE 116 (H) 04/01/2020 0914   BUN 17 03/04/2022 1009   CREATININE 1.19 03/04/2022 1009   CALCIUM 10.1 03/04/2022 1009   GFRNONAA >60 04/01/2020 0914   GFRAA 112  05/02/2018 0909    BNP No results found for: "BNP"  ProBNP No results found for: "PROBNP"  Imaging: No results found.   Assessment & Plan:   1. COPD with chronic bronchitis and emphysema (HCC) (Primary)   COPD/Emphysema Mild obstruction noted on pulmonary function test/FEV1 2.85 (93%), ratio 62. Patient is a former smoker, quit 4 years ago. He experiences a daily cough with mucus production, no change in baseline or purulence. No limitations in daily activities. Sleep and energy levels are satisfactory. Currently on Trelegy , which is effective and reasonably affordable. -Continue Trelegy as maintenance  therapy. -Advised patient stay active, maintain normal weight and continue smoking cessation. -Patient reported that he will get annual flu vaccine with PCP at his upcoming physical  -Consider lung cancer screening program due to smoking history, patient was provided information for patient to discuss with spouse.  Follow-up in 6 months unless acute visit needed for changes in symptoms or upper respiratory infection not clearing with over-the-counter medication.      Glenford Bayley, NP 05/19/2023

## 2023-05-30 ENCOUNTER — Encounter: Payer: Self-pay | Admitting: Family Medicine

## 2023-05-30 ENCOUNTER — Ambulatory Visit: Payer: BC Managed Care – PPO | Admitting: Family Medicine

## 2023-05-30 VITALS — BP 124/78 | HR 82 | Temp 98.1°F | Ht 72.0 in | Wt 228.0 lb

## 2023-05-30 DIAGNOSIS — I10 Essential (primary) hypertension: Secondary | ICD-10-CM

## 2023-05-30 DIAGNOSIS — E785 Hyperlipidemia, unspecified: Secondary | ICD-10-CM

## 2023-05-30 DIAGNOSIS — I7121 Aneurysm of the ascending aorta, without rupture: Secondary | ICD-10-CM | POA: Diagnosis not present

## 2023-05-30 DIAGNOSIS — Z789 Other specified health status: Secondary | ICD-10-CM

## 2023-05-30 DIAGNOSIS — Z23 Encounter for immunization: Secondary | ICD-10-CM | POA: Diagnosis not present

## 2023-05-30 DIAGNOSIS — Z0001 Encounter for general adult medical examination with abnormal findings: Secondary | ICD-10-CM | POA: Diagnosis not present

## 2023-05-30 DIAGNOSIS — Z125 Encounter for screening for malignant neoplasm of prostate: Secondary | ICD-10-CM

## 2023-05-30 DIAGNOSIS — Z0000 Encounter for general adult medical examination without abnormal findings: Secondary | ICD-10-CM

## 2023-05-30 DIAGNOSIS — E538 Deficiency of other specified B group vitamins: Secondary | ICD-10-CM

## 2023-05-30 DIAGNOSIS — Z79899 Other long term (current) drug therapy: Secondary | ICD-10-CM

## 2023-05-30 MED ORDER — HYDROCHLOROTHIAZIDE 25 MG PO TABS: 25.0000 mg | ORAL_TABLET | Freq: Every day | ORAL | 6 refills | Status: DC

## 2023-05-30 MED ORDER — AMLODIPINE BESYLATE 5 MG PO TABS: 5.0000 mg | ORAL_TABLET | Freq: Every day | ORAL | 0 refills | Status: DC

## 2023-05-30 MED ORDER — POTASSIUM CHLORIDE ER 10 MEQ PO TBCR: EXTENDED_RELEASE_TABLET | ORAL | 6 refills | Status: DC

## 2023-05-30 NOTE — Patient Instructions (Signed)

## 2023-05-30 NOTE — Progress Notes (Signed)
   Subjective:    Patient ID: Derek Barrett, male    DOB: 07/27/1965, 57 y.o.   MRN: [id number]  HPI The patient comes in today for a wellness visit. Patient relates tries to eat healthy Tries to stay physically active No longer smokes Does drink States a couple beers in the day and occasionally another beer or mixed drink later in the evening He states he does not do that every day He relates he tries to be healthy for the most part States he could fit in more walking for exercise    A review of their health history was completed.  A review of medications was also completed.  Any needed refills; amlodipine  Eating habits: good  Falls/  MVA accidents in past few months: no  Regular exercise: no  Specialist pt sees on regular basis: no  Preventative health issues were discussed.   Additional concerns: anxiety    Review of Systems     Objective:   Physical Exam General-in no acute distress Eyes-no discharge Lungs-respiratory rate normal, CTA CV-no murmurs,RRR Extremities skin warm dry no edema Neuro grossly normal Behavior normal, alert After shared discussion prostate exam deferred       Assessment & Plan:  1. Well adult exam Adult wellness-complete.wellness physical was conducted today. Importance of diet and exercise were discussed in detail.  Importance of stress reduction and healthy living were discussed.  In addition to this a discussion regarding safety was also covered.  We also reviewed over immunizations and gave recommendations regarding current immunization needed for age.   In addition to this additional areas were also touched on including: Preventative health exams needed:  Colonoscopy next colonoscopy 2026  Patient was advised yearly wellness exam  - Basic Metabolic Panel - Lipid Panel - PSA - Microalbumin/Creatinine Ratio, Urine - Vitamin B12 - Hepatic Function Panel  2. Immunization due (Primary) Today - Flu vaccine  trivalent PF, 6mos and older(Flulaval,Afluria,Fluarix,Fluzone)  3. Aneurysm of ascending aorta without rupture [facility]) Patient will do CT angio of the chest to look at the aorta recommend that this be done in February at the latest - CT CARDIAC SCORING (SELF PAY ONLY) - CT ANGIO CHEST AORTA W/CM & OR WO/CM  4. Screening for prostate cancer Screening PSA after shared discussion deferred on prostate exam - PSA  5. B12 deficiency Check B12 level - Vitamin B12  6. Primary hypertension Blood pressure good control continue current measures - Basic Metabolic Panel - Microalbumin/Creatinine Ratio, Urine - CT CARDIAC SCORING (SELF PAY ONLY)  7. Hyperlipidemia, unspecified hyperlipidemia type Mild hyperlipidemia issues in the past it is reasonable to do a coronary calcium to screen for coronary artery disease may need to be on statin depending on the results - Lipid Panel  8. Alcohol use Patient was encouraged to cut back on alcohol Keep alcohol 2 or less drinks per day and when possible skip having alcohol in order to lessen the effects on his health - Hepatic Function Panel  9. High risk medication use Labs ordered - Hepatic Function Panel  Follow-up will be anywhere within the next several months to 1 year depending on the results of the above tests

## 2023-06-06 ENCOUNTER — Other Ambulatory Visit: Payer: Self-pay

## 2023-06-06 MED ORDER — POTASSIUM CHLORIDE ER 10 MEQ PO TBCR: EXTENDED_RELEASE_TABLET | ORAL | 6 refills | Status: DC

## 2023-06-06 MED ORDER — AMLODIPINE BESYLATE 5 MG PO TABS: 5.0000 mg | ORAL_TABLET | Freq: Every day | ORAL | 1 refills | Status: DC

## 2023-06-06 NOTE — Telephone Encounter (Signed)
 Copied from CRM [id number]. Topic: Clinical - Medication Refill >> Jun 06, 2023 11:04 AM Powell NOVAK wrote: Most Recent Primary Care Visit:  Provider: ALPHONSA HAMILTON A  Department: [facility]  Visit Type: PHYSICAL  Date: 05/30/2023  Medication: 1-amLODipine  (NORVASC ) 5 MG tablet  2-potassium chloride  (KLOR-CON ) 10 MEQ tablet  Has the patient contacted their pharmacy? Yes-needs refill PA sent to correct pharmacy (Agent: If no, request that the patient contact the pharmacy for the refill. If patient does not wish to contact the pharmacy document the reason why and proceed with request.) (Agent: If yes, when and what did the pharmacy advise?)  Is this the correct pharmacy for this prescription? yes If no, delete pharmacy and type the correct one.  This is the patient's preferred pharmacy:   [facility] - [place], [place] - [address] [place] [address] Phone: [phone] Fax: [phone]    Has the prescription been filled recently? yes  Is the patient out of the medication? yes  Has the patient been seen for an appointment in the last year OR does the patient have an upcoming appointment? yes  Can we respond through MyChart? yes  Agent: Please be advised that Rx refills may take up to 3 business days. We ask that you follow-up with your pharmacy.

## 2023-06-12 ENCOUNTER — Other Ambulatory Visit (HOSPITAL_COMMUNITY): Payer: BC Managed Care – PPO

## 2023-06-12 ENCOUNTER — Ambulatory Visit (HOSPITAL_COMMUNITY): Payer: BC Managed Care – PPO

## 2023-06-30 DIAGNOSIS — Z0000 Encounter for general adult medical examination without abnormal findings: Secondary | ICD-10-CM | POA: Diagnosis not present

## 2023-06-30 DIAGNOSIS — E785 Hyperlipidemia, unspecified: Secondary | ICD-10-CM | POA: Diagnosis not present

## 2023-06-30 DIAGNOSIS — Z79899 Other long term (current) drug therapy: Secondary | ICD-10-CM | POA: Diagnosis not present

## 2023-06-30 DIAGNOSIS — Z125 Encounter for screening for malignant neoplasm of prostate: Secondary | ICD-10-CM | POA: Diagnosis not present

## 2023-06-30 DIAGNOSIS — I10 Essential (primary) hypertension: Secondary | ICD-10-CM | POA: Diagnosis not present

## 2023-06-30 DIAGNOSIS — E538 Deficiency of other specified B group vitamins: Secondary | ICD-10-CM | POA: Diagnosis not present

## 2023-07-01 LAB — BASIC METABOLIC PANEL
BUN/Creatinine Ratio: 9 (ref 9–20)
BUN: 10 mg/dL (ref 6–24)
CO2: 26 mmol/L (ref 20–29)
Calcium: 10.2 mg/dL (ref 8.7–10.2)
Chloride: 99 mmol/L (ref 96–106)
Creatinine, Ser: 1.06 mg/dL (ref 0.76–1.27)
Glucose: 109 mg/dL — ABNORMAL HIGH (ref 70–99)
Potassium: 4.7 mmol/L (ref 3.5–5.2)
Sodium: 141 mmol/L (ref 134–144)
eGFR: 82 mL/min/{1.73_m2} (ref >59)

## 2023-07-01 LAB — HEPATIC FUNCTION PANEL
ALT: 18 [IU]/L (ref 0–44)
AST: 21 [IU]/L (ref 0–40)
Albumin: 4.5 g/dL (ref 3.8–4.9)
Alkaline Phosphatase: 104 [IU]/L (ref 44–121)
Bilirubin Total: 0.5 mg/dL (ref 0.0–1.2)
Bilirubin, Direct: 0.17 mg/dL (ref 0.00–0.40)
Total Protein: 7.3 g/dL (ref 6.0–8.5)

## 2023-07-01 LAB — LIPID PANEL
Chol/HDL Ratio: 2.6 {ratio} (ref 0.0–5.0)
Cholesterol, Total: 233 mg/dL — ABNORMAL HIGH (ref 100–199)
HDL: 90 mg/dL (ref >39)
LDL Chol Calc (NIH): 130 mg/dL — ABNORMAL HIGH (ref 0–99)
Triglycerides: 75 mg/dL (ref 0–149)
VLDL Cholesterol Cal: 13 mg/dL (ref 5–40)

## 2023-07-01 LAB — VITAMIN B12: Vitamin B-12: 642 pg/mL (ref 232–1245)

## 2023-07-01 LAB — PSA: Prostate Specific Ag, Serum: 0.8 ng/mL (ref 0.0–4.0)

## 2023-07-01 LAB — MICROALBUMIN / CREATININE URINE RATIO
Creatinine, Urine: 306.2 mg/dL
Microalb/Creat Ratio: 6 mg/g{creat} (ref 0–29)
Microalbumin, Urine: 19.4 ug/mL

## 2023-07-02 ENCOUNTER — Encounter: Payer: Self-pay | Admitting: Family Medicine

## 2023-07-12 ENCOUNTER — Ambulatory Visit (HOSPITAL_COMMUNITY)
Admission: RE | Admit: 2023-07-12 | Discharge: 2023-07-12 | Disposition: A | Discharge status: Home / Self Care | Payer: BC Managed Care – PPO | Source: Ambulatory Visit | Attending: Family Medicine | Admitting: Family Medicine

## 2023-07-12 ENCOUNTER — Ambulatory Visit (HOSPITAL_COMMUNITY)
Admission: RE | Admit: 2023-07-12 | Discharge: 2023-07-12 | Disposition: A | Discharge status: Home / Self Care | Payer: Self-pay | Source: Ambulatory Visit | Attending: Family Medicine | Admitting: Family Medicine

## 2023-07-12 DIAGNOSIS — I7121 Aneurysm of the ascending aorta, without rupture: Secondary | ICD-10-CM | POA: Insufficient documentation

## 2023-07-12 DIAGNOSIS — I10 Essential (primary) hypertension: Secondary | ICD-10-CM | POA: Insufficient documentation

## 2023-07-12 DIAGNOSIS — K449 Diaphragmatic hernia without obstruction or gangrene: Secondary | ICD-10-CM | POA: Diagnosis not present

## 2023-07-12 DIAGNOSIS — K760 Fatty (change of) liver, not elsewhere classified: Secondary | ICD-10-CM | POA: Diagnosis not present

## 2023-07-12 DIAGNOSIS — J439 Emphysema, unspecified: Secondary | ICD-10-CM | POA: Diagnosis not present

## 2023-07-12 MED ORDER — IOHEXOL 350 MG/ML SOLN
75.0000 mL | Freq: Once | INTRAVENOUS | Status: AC | PRN
  Administered 2023-07-12: 75 mL via INTRAVENOUS

## 2023-07-25 ENCOUNTER — Other Ambulatory Visit (HOSPITAL_BASED_OUTPATIENT_CLINIC_OR_DEPARTMENT_OTHER): Payer: BC Managed Care – PPO

## 2023-07-25 ENCOUNTER — Ambulatory Visit (HOSPITAL_BASED_OUTPATIENT_CLINIC_OR_DEPARTMENT_OTHER): Payer: BC Managed Care – PPO

## 2023-07-30 ENCOUNTER — Encounter: Payer: Self-pay | Admitting: Family Medicine

## 2023-07-30 DIAGNOSIS — R931 Abnormal findings on diagnostic imaging of heart and coronary circulation: Secondary | ICD-10-CM | POA: Insufficient documentation

## 2023-07-30 DIAGNOSIS — R911 Solitary pulmonary nodule: Secondary | ICD-10-CM | POA: Insufficient documentation

## 2023-08-04 ENCOUNTER — Telehealth: Payer: Self-pay | Admitting: Family Medicine

## 2023-08-04 ENCOUNTER — Encounter: Payer: Self-pay | Admitting: *Deleted

## 2023-08-04 NOTE — Telephone Encounter (Signed)
 Copied from CRM [id number]. Topic: Clinical - Lab/Test Results >> Aug 04, 2023  1:12 PM Shardie S wrote: Reason for CRM: Patient called in regarding lab results. Contacted [facility] and was told to inform patient that he has a followup scheduled in 3 weeks and  that the provider's office will contact him.

## 2023-08-04 NOTE — Telephone Encounter (Signed)
 See result note- patient scheduled for follow up with Dr Lorin Picket and notified via my chart

## 2023-08-15 ENCOUNTER — Encounter: Payer: Self-pay | Admitting: Family Medicine

## 2023-08-15 ENCOUNTER — Ambulatory Visit: Payer: BC Managed Care – PPO | Admitting: Family Medicine

## 2023-08-15 VITALS — BP 124/76 | HR 73 | Temp 98.2°F | Ht 72.0 in | Wt 229.8 lb

## 2023-08-15 DIAGNOSIS — I10 Essential (primary) hypertension: Secondary | ICD-10-CM

## 2023-08-15 DIAGNOSIS — I7121 Aneurysm of the ascending aorta, without rupture: Secondary | ICD-10-CM

## 2023-08-15 DIAGNOSIS — I712 Thoracic aortic aneurysm, without rupture, unspecified: Secondary | ICD-10-CM | POA: Diagnosis not present

## 2023-08-15 DIAGNOSIS — R931 Abnormal findings on diagnostic imaging of heart and coronary circulation: Secondary | ICD-10-CM

## 2023-08-15 DIAGNOSIS — E785 Hyperlipidemia, unspecified: Secondary | ICD-10-CM

## 2023-08-15 DIAGNOSIS — R911 Solitary pulmonary nodule: Secondary | ICD-10-CM

## 2023-08-15 DIAGNOSIS — K760 Fatty (change of) liver, not elsewhere classified: Secondary | ICD-10-CM

## 2023-08-15 MED ORDER — SERTRALINE HCL 25 MG PO TABS: 25.0000 mg | ORAL_TABLET | Freq: Every day | ORAL | 3 refills | Status: DC

## 2023-08-15 MED ORDER — ROSUVASTATIN CALCIUM 10 MG PO TABS: 10.0000 mg | ORAL_TABLET | Freq: Every day | ORAL | 1 refills | Status: AC

## 2023-08-15 NOTE — Progress Notes (Signed)
 Patient  Subjective:    Patient ID: STEPFON Barrett, male    DOB: 02-Nov-1965, 58 y.o.   MRN: [id number]  Discussed the use of AI scribe software for clinical note transcription with the patient, who gave verbal consent to proceed.  History of Present Illness   The patient presents for follow-up of aortic dilation and pulmonary nodule.  He has a history of aortic dilation first noted in 2018, which remained stable at 3.1 cm until 2023. Recent imaging indicates an increase to 4 cm by 3.2 cm, suggesting enlargement. He is asymptomatic.  A recent CT scan identified a new 8 mm pulmonary nodule in the left lower lung. Nodules under 1 cm are typically monitored for changes in size, as they cannot be accurately assessed by PET scan at this size.  He has a history of smoking but quit five years ago. Recent imaging has shown changes in the lungs consistent with COPD, attributed to aging and past smoking. No current respiratory symptoms and continues to abstain from smoking.  Imaging also revealed hepatic steatosis. His liver enzymes were normal in January.  A coronary calcium scan indicated low levels of coronary artery calcification, suggesting a low likelihood of significant coronary artery disease. He is asymptomatic for angina.  He has a small hiatal hernia, which is currently asymptomatic. Larger hernias can cause symptoms like regurgitation and nausea, but his current condition does not require intervention.         Review of Systems     Objective:    Physical Exam       Lungs clear heart regular pulse normal BP good       Assessment & Plan:  Assessment and Plan    Aortic dilation Aortic dilation increased from 3.1 cm to 4.0 cm by 3.2 cm, nearing surgical threshold. Asymptomatic, focus on blood pressure control and smoking cessation to prevent further enlargement. - Monitor blood pressure, target <130/80 mmHg. - Annual follow-up imaging for aortic size. - Encourage smoking  cessation. - Advise dietary modifications for cardiovascular health.  Pulmonary nodule 8 mm pulmonary nodule in left lower lung, too small for PET scan. Monitor for size changes due to smoking history risk factor. - Follow-up CT scan in 6 months. - Continue smoking cessation efforts.  Chronic obstructive pulmonary disease (COPD) Lung changes consistent with COPD, asymptomatic. Smoking cessation to prevent progression. - Continue smoking cessation efforts.  Hiatal hernia Small, asymptomatic hiatal hernia. No intervention needed unless symptomatic.  Hepatic steatosis Hepatic steatosis managed with lifestyle changes. Normal liver enzymes in January. - Advise healthy eating and regular physical activity. - Limit alcohol to =2 drinks/day.  Coronary artery disease risk Low coronary calcium score, low CAD risk. Statin therapy to lower LDL <70 mg/dL, reduce heart disease risk by 40%. - Initiate statin therapy (Crestor). - Follow-up lab work in 10-12 weeks. - Advise dietary modifications for cardiovascular health.      1. Hyperlipidemia, unspecified hyperlipidemia type (Primary) Goal is to keep LDL below 70 Follow-up lab work in approximately 3 months follow-up office visit at that time Start Crestor 10 mg each evening Side effects discussed   2. Aneurysm of ascending aorta without rupture (HCC) According to vascular surgery if he gets above 4.5 cm we will need to refer we will follow this on a yearly basis per recommendation  3. Primary hypertension Blood pressure good control try to keep 130/70 if possible  4. Thoracic aortic aneurysm without rupture, unspecified part (HCC) Please see discussion above patient was  educated regarding the signs and symptoms of the thoracic tear/dissection/rupture  5. Pulmonary nodule Follow this up in 6 months time  6. Hepatic steatosis Healthy diet limit alcohol intake  7. Elevated coronary artery calcium score Start statin keep LDL below  70  Follow-up in approximately 3 to 4 months  We are putting into the reminder file both his CT scan of the chest to look at the pulmonary nodule and also CTA of the thoracic aortic root Chest CT August 2025 CTA of aortic root will be February 2026 Patient is aware of the importance of keeping blood pressure low as well as fitting and walking, minimizing salt, keeping alcohol to 2 drinks or less, keeping statins going Keeping LDL below 55 if possible Staying away from smoking

## 2023-11-09 ENCOUNTER — Other Ambulatory Visit: Payer: Self-pay | Admitting: Primary Care

## 2023-12-19 ENCOUNTER — Other Ambulatory Visit: Payer: Self-pay | Admitting: Family Medicine

## 2024-01-04 ENCOUNTER — Other Ambulatory Visit: Payer: Self-pay | Admitting: Family Medicine

## 2024-01-11 ENCOUNTER — Other Ambulatory Visit: Payer: Self-pay | Admitting: Family Medicine

## 2024-01-17 ENCOUNTER — Other Ambulatory Visit: Payer: Self-pay | Admitting: *Deleted

## 2024-01-17 DIAGNOSIS — R911 Solitary pulmonary nodule: Secondary | ICD-10-CM

## 2024-01-31 ENCOUNTER — Ambulatory Visit: Admitting: Family Medicine

## 2024-02-07 ENCOUNTER — Ambulatory Visit (HOSPITAL_COMMUNITY)
Admission: RE | Admit: 2024-02-07 | Discharge: 2024-02-07 | Disposition: A | Discharge status: Home / Self Care | Source: Ambulatory Visit | Attending: Family Medicine | Admitting: Family Medicine

## 2024-02-07 DIAGNOSIS — I700 Atherosclerosis of aorta: Secondary | ICD-10-CM | POA: Diagnosis not present

## 2024-02-07 DIAGNOSIS — R918 Other nonspecific abnormal finding of lung field: Secondary | ICD-10-CM | POA: Diagnosis not present

## 2024-02-07 DIAGNOSIS — R911 Solitary pulmonary nodule: Secondary | ICD-10-CM | POA: Diagnosis not present

## 2024-02-16 ENCOUNTER — Ambulatory Visit: Payer: Self-pay | Admitting: Family Medicine

## 2024-02-16 DIAGNOSIS — Z79899 Other long term (current) drug therapy: Secondary | ICD-10-CM

## 2024-02-16 DIAGNOSIS — E785 Hyperlipidemia, unspecified: Secondary | ICD-10-CM

## 2024-02-16 DIAGNOSIS — I10 Essential (primary) hypertension: Secondary | ICD-10-CM

## 2024-02-25 ENCOUNTER — Other Ambulatory Visit: Payer: Self-pay | Admitting: Family Medicine

## 2024-03-26 ENCOUNTER — Ambulatory Visit: Admitting: Pulmonary Disease

## 2024-04-08 ENCOUNTER — Encounter: Payer: Self-pay | Admitting: Family Medicine

## 2024-04-08 ENCOUNTER — Ambulatory Visit: Admitting: Family Medicine

## 2024-04-08 VITALS — BP 120/72 | HR 68 | Temp 97.7°F | Ht 72.0 in | Wt 233.0 lb

## 2024-04-08 DIAGNOSIS — R931 Abnormal findings on diagnostic imaging of heart and coronary circulation: Secondary | ICD-10-CM | POA: Diagnosis not present

## 2024-04-08 DIAGNOSIS — Z0001 Encounter for general adult medical examination with abnormal findings: Secondary | ICD-10-CM | POA: Diagnosis not present

## 2024-04-08 DIAGNOSIS — Z23 Encounter for immunization: Secondary | ICD-10-CM

## 2024-04-08 DIAGNOSIS — Z0000 Encounter for general adult medical examination without abnormal findings: Secondary | ICD-10-CM

## 2024-04-08 NOTE — Progress Notes (Signed)
   Subjective:    Patient ID: Derek Barrett, male    DOB: [birth year], 58 y.o.   MRN: [id number]  HPI Follow up or check up/ physical No concerns Flu shot  The patient comes in today for a wellness visit.    A review of their health history was completed.  A review of medications was also completed.  Any needed refills; will update today  Eating habits: Relatively healthy eating habits eats healthy skips  Falls/  MVA accidents in past few months: No accidents or injuries  Regular exercise: Stays busy around his home and plays  Specialist pt sees on regular basis: None currently  Preventative health issues were discussed.   Additional concerns: None  Patient does not smoke Recent scan showed resolution of nodules Patient does have 3-4 beers per day he was encouraged to cut this back to 2 a day Patient tries to be safe from what he does   Review of Systems     Objective:   Physical Exam  General-in no acute distress Eyes-no discharge Lungs-respiratory rate normal, CTA CV-no murmurs,RRR Extremities skin warm dry no edema Neuro grossly normal Behavior normal, alert Prostate exam deferred by patient      Assessment & Plan:  1. Immunization due (Primary) Today - Flu vaccine trivalent PF, 6mos and older(Flulaval,Afluria,Fluarix,Fluzone) - Pneumococcal conjugate vaccine 20-valent (Prevnar 20)  2. Well adult exam Adult wellness-complete.wellness physical was conducted today. Importance of diet and exercise were discussed in detail.  Importance of stress reduction and healthy living were discussed.  In addition to this a discussion regarding safety was also covered.  We also reviewed over immunizations and gave recommendations regarding current immunization needed for age.   In addition to this additional areas were also touched on including: Preventative health exams needed:  Colonoscopy patient agrees to try Cologuard  Patient was advised yearly  wellness exam   3. Elevated coronary artery calcium  score Goal is to get LDL below 70 patient will do lab work at the end of January started February  Follow-up office visit 6 months Encourage patient to cut back on alcohol use to 1 or 2 drinks in the evening Follow-up wellness in 1 year

## 2024-04-18 ENCOUNTER — Encounter: Payer: Self-pay | Admitting: Primary Care

## 2024-04-18 ENCOUNTER — Ambulatory Visit: Admitting: Primary Care

## 2024-04-18 VITALS — BP 140/78 | HR 62 | Temp 97.8°F | Ht 72.0 in | Wt 231.2 lb

## 2024-04-18 DIAGNOSIS — Z87891 Personal history of nicotine dependence: Secondary | ICD-10-CM

## 2024-04-18 DIAGNOSIS — J449 Chronic obstructive pulmonary disease, unspecified: Secondary | ICD-10-CM

## 2024-04-18 DIAGNOSIS — R911 Solitary pulmonary nodule: Secondary | ICD-10-CM | POA: Diagnosis not present

## 2024-04-18 MED ORDER — TRELEGY ELLIPTA 100-62.5-25 MCG/ACT IN AEPB: 1.0000 | INHALATION_SPRAY | Freq: Every day | RESPIRATORY_TRACT | 11 refills | Status: AC

## 2024-04-18 NOTE — Patient Instructions (Signed)
  VISIT SUMMARY: You had a follow-up appointment to check on your COPD and overall health. Your COPD is well-managed with your current medication, and you are experiencing minimal symptoms. We also discussed your increased risk for lung cancer due to your smoking history and reviewed your recent CT scan results. Additionally, we talked about your cholesterol management and some dietary recommendations.  YOUR PLAN: -CHRONIC OBSTRUCTIVE PULMONARY DISEASE (COPD): COPD is a chronic lung condition that makes it hard to breathe. Your COPD is well-managed with Trelegy, and you are experiencing minimal shortness of breath and a mild morning cough. Continue taking Trelegy 100 mcg, one puff daily. We have refilled your prescription for one year, and you should follow up in one year unless you develop new symptoms.  -INCREASED RISK FOR LUNG CANCER DUE TO SMOKING HISTORY: Due to your history of smoking, you are at an increased risk for lung cancer. Your recent CT scan showed no signs of lung cancer, but you are eligible for a lung cancer screening program. We will schedule your next low-dose CT scan for next fall to monitor for any changes.  -MINIMAL CORONARY ATHEROSCLEROSIS: Coronary atherosclerosis is the buildup of plaque in the arteries of your heart. This was noted on your recent CT scan and is likely related to age, smoking history, and genetics. You are currently managing this with Crestor . Continue taking Crestor  as prescribed, and try to reduce your intake of saturated fats in your diet.  INSTRUCTIONS: Please continue taking Trelegy and Crestor  as prescribed. Follow up in one year for your COPD unless you develop new symptoms. You are referred to the lung cancer screening program, and we will schedule your next low-dose CT scan for next fall. Additionally, try to reduce your intake of saturated fats to help manage your cholesterol.  Follow-up 1 year with Beth Np or sooner if needed

## 2024-04-18 NOTE — Progress Notes (Signed)
 @Patient  ID: Derek Barrett, male    DOB: 05-01-1966, 58 y.o.   MRN: [id number]  Chief Complaint  Patient presents with   Follow-up    No sob, cough in am -white    Referring provider: Alphonsa Glendia LABOR, MD  HPI: 58 year old, former smoker. PMH significant for HTN, B12 deficiency, thoracic aortic aneurysm.   Previous [facility] pulmonary encounter:  05/19/2023 Discussed the use of AI scribe software for clinical note transcription with the patient, who gave verbal consent to proceed.  History of Present Illness   The patient, with a history of emphysema and obstructive lung disease secondary to a significant smoking history, presents for a follow-up visit. The patient quit smoking four years ago and has been compliant with his prescribed Trelegy inhaler for COPD/emphysema management. No prior breathing test until the current visit.  The patient reports a daily cough, which is more pronounced in the mornings and is associated with mucus production. He also reports some occasional wheezing/chest tightness, rated at 2 out of 5. Despite these symptoms, the patient is able to maintain his daily activities without significant limitation. He reports no sleep disturbances related to his lung condition and has an energy level rated at 3 out of 5. He has not needed to use a rescue inhaler since starting the Trelegy.  He has expressed interest in discussing the lung cancer screening program with his spouse before making a decision. The patient's last CT scan in November 2023 showed emphysema but no suspicious pulmonary nodules or masses.      1. COPD with chronic bronchitis and emphysema (HCC) (Primary)   COPD/Emphysema Mild obstruction noted on pulmonary function test/FEV1 2.85 (93%), ratio 62. Patient is a former smoker, quit 4 years ago. He experiences a daily cough with mucus production, no change in baseline or purulence. No limitations in daily activities. Sleep and energy levels are  satisfactory. Currently on Trelegy 100mcg, which is effective and reasonably affordable. -Continue Trelegy as maintenance therapy. -Advised patient stay active, maintain normal weight and continue smoking cessation. -Patient reported that he will get annual flu vaccine with PCP at his upcoming physical  -Consider lung cancer screening program due to smoking history, patient was provided information for patient to discuss with spouse.   04/18/2024- Interim hx  Discussed the use of AI scribe software for clinical note transcription with the patient, who gave verbal consent to proceed.  History of Present Illness Derek Barrett is a 58 year old male with COPD who presents for a one year follow-up.  He is on Trelegy, 100 micrograms, one puff daily, which he finds beneficial. He experiences minimal shortness of breath, primarily during high exertion activities such as cutting or splitting wood. He has a morning cough that clears up throughout the day, rating it as a one out of five, along with phlegm in his chest.  A CT scan in September showed resolution clustered nodules at the lung base, which were seen on a prior CT scan. He is on Crestor  for cholesterol management.  He quit smoking five to six years ago after a 30 pack-year history, having smoked a pack a day from age 36 to 57. He received a flu vaccine last month.  CAT score 11  Pulmonary function testing: 05/19/2023 PFT>> FVC 4.58 (115%), FEV1 2.85 (93%), ratio 62, DLCO 142% Mild obstruction with borderline BD response, mild curvature on flow vol loop   Allergies  Allergen Reactions   Penicillins Other (See Comments)  Reaction: Unknown family History    Immunization History  Administered Date(s) Administered   Influenza, Seasonal, Injecte, Preservative Fre 05/30/2023, 04/08/2024   Influenza,inj,Quad PF,6+ Mos 02/15/2021, 02/16/2022   Influenza-Unspecified 04/15/2019   Moderna Sars-Covid-2 Vaccination 08/19/2019,  09/19/2019, 11/18/2020   PNEUMOCOCCAL CONJUGATE-20 04/08/2024   Tdap 02/16/2022    Past Medical History:  Diagnosis Date   Aneurysm    B12 deficiency 05/04/2018   Patient's MCV was elevated, B12 low normal, instructed to do oral B12 supplementation, instructed to limit alcohol use   Hypertension    Stage 1 mild COPD by GOLD classification (HCC) 05/19/2023    Tobacco History: Social History   Tobacco Use  Smoking Status Former   Current packs/day: 1.00   Types: Cigarettes  Smokeless Tobacco Never  Tobacco Comments   1 pack a day since age 38   Counseling given: Not Answered Tobacco comments: 1 pack a day since age 78   Outpatient Medications Prior to Visit  Medication Sig Dispense Refill   amLODipine  (NORVASC ) 5 MG tablet TAKE ONE TABLET BY MOUTH ONCE DAILY 90 tablet 1   fexofenadine (ALLEGRA) 180 MG tablet Take 180 mg by mouth daily.     hydrochlorothiazide  (HYDRODIURIL ) 25 MG tablet TAKE ONE TABLET BY MOUTH EVERY DAY 30 tablet 6   potassium chloride  (KLOR-CON ) 10 MEQ tablet TAKE TWO TABLETS BY MOUTH EVERY MORNING 60 tablet 6   rosuvastatin  (CRESTOR ) 10 MG tablet Take 1 tablet (10 mg total) by mouth daily. 90 tablet 1   sertraline  (ZOLOFT ) 25 MG tablet Take 1 tablet (25 mg total) by mouth daily. 30 tablet 5   TRELEGY ELLIPTA  100-62.5-25 MCG/ACT AEPB INHALE 1 PUFF BY MOUTH EVERY DAY 60 each 5   vitamin B-12 (CYANOCOBALAMIN ) 500 MCG tablet Take 500 mcg by mouth daily.     albuterol  (VENTOLIN  HFA) 108 (90 Base) MCG/ACT inhaler INHALE 2 PUFFS INTO THE LUNGS EVERY 4 HOURS AS NEEDED FOR WHEEZING. (Patient not taking: Reported on 04/18/2024) 18 each 1   No facility-administered medications prior to visit.   Review of Systems  Review of Systems  Respiratory:  Positive for cough.        DOE     Physical Exam  BP (!) 140/78 (BP Location: Left Arm, Patient Position: Sitting, Cuff Size: Large)   Pulse 62   Temp 97.8 F (36.6 C) (Oral)   Ht 6' (1.829 m)   Wt 231 lb 3.2 oz  (104.9 kg)   SpO2 96%   BMI 31.36 kg/m  Physical Exam Constitutional:      General: He is not in acute distress.    Appearance: Normal appearance. He is well-developed. He is not ill-appearing.  HENT:     Head: Normocephalic and atraumatic.     Mouth/Throat:     Mouth: Mucous membranes are moist.     Pharynx: Oropharynx is clear.  Cardiovascular:     Rate and Rhythm: Normal rate and regular rhythm.     Heart sounds: Normal heart sounds.  Pulmonary:     Effort: Pulmonary effort is normal. No respiratory distress.     Breath sounds: Normal breath sounds. No wheezing or rhonchi.     Comments: CTA Musculoskeletal:        General: Normal range of motion.     Cervical back: Normal range of motion and neck supple.  Skin:    General: Skin is warm and dry.     Findings: No erythema or rash.  Neurological:     General: No  focal deficit present.     Mental Status: He is alert and oriented to person, place, and time. Mental status is at baseline.  Psychiatric:        Mood and Affect: Mood normal.        Behavior: Behavior normal.        Thought Content: Thought content normal.        Judgment: Judgment normal.     Lab Results:  CBC    Component Value Date/Time   WBC 5.6 03/04/2018 1847   RBC 4.92 03/04/2018 1847   HGB 17.5 03/04/2018 1847   HGB 15.4 03/16/2016 0943   HCT 49.4 03/04/2018 1847   HCT 43.7 03/16/2016 0943   PLT 223 03/04/2018 1847   PLT 218 03/16/2016 0943   MCV 100.4 (H) 03/04/2018 1847   MCV 95 03/16/2016 0943   MCH 35.5 (H) 03/04/2018 1847   MCHC 35.3 03/04/2018 1847   RDW 12.7 03/04/2018 1847   RDW 12.7 03/16/2016 0943   LYMPHSABS 2.3 03/04/2018 1847   LYMPHSABS 1.6 03/16/2016 0943   MONOABS 0.6 03/04/2018 1847   EOSABS 0.2 03/04/2018 1847   EOSABS 0.2 03/16/2016 0943   BASOSABS 0.0 03/04/2018 1847   BASOSABS 0.0 03/16/2016 0943    BMET    Component Value Date/Time   NA 141 06/30/2023 0937   K 4.7 06/30/2023 0937   CL 99 06/30/2023 0937   CO2  26 06/30/2023 0937   GLUCOSE 109 (H) 06/30/2023 0937   GLUCOSE 116 (H) 04/01/2020 0914   BUN 10 06/30/2023 0937   CREATININE 1.06 06/30/2023 0937   CALCIUM  10.2 06/30/2023 0937   GFRNONAA >60 04/01/2020 0914   GFRAA 112 05/02/2018 0909    BNP No results found for: BNP  ProBNP No results found for: PROBNP  Imaging: No results found.   Assessment & Plan:   1. Stage 1 mild COPD by GOLD classification (HCC) (Primary)  2. Pulmonary nodule - Ambulatory Referral for Lung Cancer Scre  3. Former smoker - Ambulatory Referral for Lung Cancer Scre   Assessment and Plan Assessment & Plan Moderate chronic obstructive pulmonary disease (COPD) COPD is well-managed with Trelegy 100 mcg, one puff daily. Reports minimal dyspnea during high exertion activities and a mild morning cough that resolves throughout the day.  - Continue Trelegy 100 mcg, one puff daily - Refilled Trelegy for one year - Scheduled follow-up in one year unless symptoms develop - Up to date with flu and pneumonia vaccines   Increased risk for lung cancer due to smoking history Increased risk for lung cancer due to a 30 pack-year smoking history, having quit 5-6 years ago. Recent CT scan in September showed no signs of lung cancer. Eligible for lung cancer screening program due to smoking history and increased risk. Annual low-dose CT scan is considered safe and effective for early detection. - Referred to lung cancer screening program - Will schedule next low-dose CT scan for next fall  Minimal coronary atherosclerosis Noted on recent CT scan - Continue Crestor    Almarie LELON Ferrari, NP 04/18/2024

## 2024-06-18 ENCOUNTER — Other Ambulatory Visit: Payer: Self-pay | Admitting: Family Medicine

## 2024-06-28 ENCOUNTER — Other Ambulatory Visit: Payer: Self-pay | Admitting: Family Medicine

## 2024-10-07 ENCOUNTER — Ambulatory Visit: Admitting: Family Medicine
